# Patient Record
Sex: Male | Born: 1990 | Race: White | Hispanic: No | Marital: Single | Smoking: Never smoker
Health system: Southern US, Community
[De-identification: ages and names within clinical notes are randomized; demographics above are authoritative.]

## PROBLEM LIST (undated history)

## (undated) DIAGNOSIS — F419 Anxiety disorder, unspecified: Secondary | ICD-10-CM

## (undated) HISTORY — DX: Anxiety disorder, unspecified: F41.9

## (undated) HISTORY — PX: WISDOM TOOTH EXTRACTION: SHX21

---

## 2015-10-24 ENCOUNTER — Encounter: Payer: Self-pay | Admitting: Family Medicine

## 2015-10-24 ENCOUNTER — Ambulatory Visit (INDEPENDENT_AMBULATORY_CARE_PROVIDER_SITE_OTHER): Payer: Managed Care, Other (non HMO) | Admitting: Family Medicine

## 2015-10-24 VITALS — BP 134/72 | HR 67 | Temp 98.5°F | Resp 16 | Ht 71.0 in | Wt 265.0 lb

## 2015-10-24 DIAGNOSIS — Z0000 Encounter for general adult medical examination without abnormal findings: Secondary | ICD-10-CM

## 2015-10-24 DIAGNOSIS — F419 Anxiety disorder, unspecified: Secondary | ICD-10-CM | POA: Diagnosis not present

## 2015-10-24 NOTE — Assessment & Plan Note (Signed)
Controlled with Zoloft. Doing well with current dosing. Recheck 1 year.

## 2015-10-24 NOTE — Patient Instructions (Signed)

## 2015-10-24 NOTE — Progress Notes (Signed)
Subjective:    Patient ID: Andrew Mccormick, male    DOB: Sep 07, 1990, 25 y.o.   MRN: [id number]  HPI: Andrew Mccormick is a 25 y.o. male presenting on 10/24/2015 for Establish Care   HPI  Pt presents to establish care today. Previous care provider was Dr. Carlena SaxPulcinella has retired-.  It has been years since His last PCP visit. Records from previous provider will be requested and reviewed. Current medical problems include:  Anxiety: Placed on Zoloft 1 year ago. [facility] Seeing only refills.  Feels like zoloft is working well.   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   Exercise- once per week-mostly cardio and some resistance. Walks with dog.  Diet: Average. Does eat meat. Source of protein- tofu and beans. Not vegan.   Non smoker.    Past Medical History  Diagnosis Date  . Anxiety    Social History   Social History  . Marital Status: Single    Spouse Name: N/A  . Number of Children: N/A  . Years of Education: N/A   Occupational History  . Not on file.   Social History Main Topics  . Smoking status: Never Smoker   . Smokeless tobacco: Not on file  . Alcohol Use: No  . Drug Use: No  . Sexual Activity: Not on file   Other Topics Concern  . Not on file   Social History Narrative  . No narrative on file   Family History  Problem Relation Age of Onset  . Healthy Mother   . Healthy Father   . Heart disease Paternal Grandfather    No current outpatient prescriptions on file prior to visit.   No current facility-administered medications on file prior to visit.    Review of Systems  Constitutional: Negative for fever and chills.  HENT: Negative for congestion, sore throat and trouble swallowing.   Eyes: Negative for visual disturbance.  Respiratory: Negative for chest tightness, shortness of breath and wheezing.   Cardiovascular: Negative for chest pain, palpitations and leg swelling.  Gastrointestinal: Negative for nausea, vomiting  and abdominal pain.  Endocrine: Negative.  Negative for cold intolerance, heat intolerance, polydipsia, polyphagia and polyuria.  Genitourinary: Negative for dysuria, urgency, discharge, penile pain and testicular pain.  Musculoskeletal: Negative for back pain, joint swelling and arthralgias.  Skin: Negative.   Neurological: Negative for dizziness, weakness, numbness and headaches.  Psychiatric/Behavioral: Negative for sleep disturbance and dysphoric mood.   Per HPI unless specifically indicated above [facility] screen [facility] 10/24/2015  Decreased Interest 0  Down, Depressed, Hopeless 0  PHQ - 2 Score 0    GAD 7 : Generalized Anxiety Score 10/24/2015  Nervous, Anxious, on Edge 0  Control/stop worrying 0  Worry too much - different things 0  Trouble relaxing 0  Restless 0  Easily annoyed or irritable 1  Afraid - awful might happen 0  Total GAD 7 Score 1  Anxiety Difficulty Not difficult at all      Objective:    BP 134/72 mmHg  Pulse 67  Temp(Src) 98.5 F (36.9 C) (Oral)  Resp 16  Ht 5\' 11"  (1.803 m)  Wt 265 lb (120.203 kg)  BMI 36.98 kg/m2  Wt Readings from Last 3 Encounters:  10/24/15 265 lb (120.203 kg)    Physical Exam  Constitutional: He is oriented to person, place, and time. He appears well-developed and well-nourished. No distress.  HENT:  Head: Normocephalic and atraumatic.  Neck: Neck supple.  No thyromegaly present.  Cardiovascular: Normal rate, regular rhythm and normal heart sounds.  Exam reveals no gallop and no friction rub.   No murmur heard. Pulmonary/Chest: Effort normal and breath sounds normal. He has no wheezes.  Abdominal: Soft. Bowel sounds are normal. He exhibits no distension. There is no tenderness. There is no rebound.  Genitourinary:  Deferred due to patient preference.   Musculoskeletal: Normal range of motion. He exhibits no edema or tenderness.  Neurological: He is alert and oriented to person, place, and time. He has normal reflexes.    Skin: Skin is warm and dry. No rash noted. No erythema.  Psychiatric: He has a normal mood and affect. His behavior is normal. Thought content normal.   No results found for this or any previous visit.    Assessment & Plan:   Problem List Items Addressed This Visit      Other   Anxiety    Controlled with Zoloft. Doing well with current dosing. Recheck 1 year.       Relevant Medications   sertraline (ZOLOFT) 100 MG tablet    Other Visit Diagnoses    Preventative health care    -  Primary    Well visit. Health maintenance reviewed. Pt thinks labs were done when he started his job. Will fax over lab work. Return 1 year or PRN.        Meds ordered this encounter  Medications  . sertraline (ZOLOFT) 100 MG tablet    Sig: Take 100 mg by mouth daily.     Follow up plan: Return in about 1 year (around 10/23/2016), or if symptoms worsen or fail to improve.

## 2015-10-29 ENCOUNTER — Encounter: Payer: Self-pay | Admitting: Family Medicine

## 2016-02-13 ENCOUNTER — Emergency Department: Payer: Worker's Compensation

## 2016-02-13 ENCOUNTER — Encounter: Payer: Self-pay | Admitting: Emergency Medicine

## 2016-02-13 ENCOUNTER — Emergency Department
Admission: EM | Admit: 2016-02-13 | Discharge: 2016-02-13 | Disposition: A | Discharge status: Home / Self Care | Payer: Worker's Compensation | Attending: Emergency Medicine | Admitting: Emergency Medicine

## 2016-02-13 DIAGNOSIS — Y999 Unspecified external cause status: Secondary | ICD-10-CM | POA: Insufficient documentation

## 2016-02-13 DIAGNOSIS — Y92410 Unspecified street and highway as the place of occurrence of the external cause: Secondary | ICD-10-CM | POA: Insufficient documentation

## 2016-02-13 DIAGNOSIS — Y9389 Activity, other specified: Secondary | ICD-10-CM | POA: Insufficient documentation

## 2016-02-13 DIAGNOSIS — S161XXA Strain of muscle, fascia and tendon at neck level, initial encounter: Secondary | ICD-10-CM | POA: Insufficient documentation

## 2016-02-13 DIAGNOSIS — S0990XA Unspecified injury of head, initial encounter: Secondary | ICD-10-CM | POA: Diagnosis present
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
## 2016-10-30 ENCOUNTER — Encounter: Payer: Self-pay | Admitting: Family Medicine

## 2016-10-30 ENCOUNTER — Ambulatory Visit (INDEPENDENT_AMBULATORY_CARE_PROVIDER_SITE_OTHER): Payer: Managed Care, Other (non HMO) | Admitting: Family Medicine

## 2016-10-30 VITALS — BP 130/77 | HR 64 | Temp 98.3°F | Ht 71.0 in | Wt 248.0 lb

## 2016-10-30 DIAGNOSIS — F419 Anxiety disorder, unspecified: Secondary | ICD-10-CM | POA: Diagnosis not present

## 2016-10-30 DIAGNOSIS — Z0000 Encounter for general adult medical examination without abnormal findings: Secondary | ICD-10-CM

## 2016-10-30 DIAGNOSIS — Z7689 Persons encountering health services in other specified circumstances: Secondary | ICD-10-CM

## 2016-10-30 LAB — MICROSCOPIC EXAMINATION
BACTERIA UA: NONE SEEN
RBC, UA: NONE SEEN /hpf (ref 0–?)

## 2016-10-30 LAB — UA/M W/RFLX CULTURE, ROUTINE
BILIRUBIN UA: NEGATIVE
Glucose, UA: NEGATIVE
KETONES UA: NEGATIVE
Leukocytes, UA: NEGATIVE
Nitrite, UA: NEGATIVE
PH UA: 5.5 (ref 5.0–7.5)
RBC UA: NEGATIVE
Specific Gravity, UA: 1.025 (ref 1.005–1.030)
UUROB: 0.2 mg/dL (ref 0.2–1.0)

## 2016-10-30 NOTE — Assessment & Plan Note (Signed)
Stable on zoloft, continue current regimen 

## 2016-10-30 NOTE — Progress Notes (Signed)
BP 130/77   Pulse 64   Temp 98.3 F (36.8 C)   Ht 5\' 11"  (1.803 m)   Wt 248 lb (112.5 kg)   SpO2 97%   BMI 34.59 kg/m    Subjective:    Patient ID: Andrew Mccormick, male    DOB: Sep 21, 1990, 26 y.o.   MRN: [id number]  HPI: Andrew Mccormick is a 26 y.o. male presenting on 10/30/2016 for comprehensive medical examination. Current medical complaints include:see below  Patient presents today to establish care. Only medication he takes is zoloft. Has been on it for about 3 years now for his anxiety, no concerns there. Denies any mood issues, SI/HI, anxiousness affecting ADLs. States he works for [employer] and they are now requiring annual exams and he needs one by the end of the month. Fasting today.   Depression Screen done today and results listed below:  Depression screen [facility] 10/30/2016 10/24/2015  Decreased Interest 0 0  Down, Depressed, Hopeless 0 0  PHQ - 2 Score 0 0    The patient does not have a history of falls. I did not complete a risk assessment for falls. A plan of care for falls was not documented.   Past Medical History:  Past Medical History:  Diagnosis Date  . Anxiety     Surgical History:  Past Surgical History:  Procedure Laterality Date  . WISDOM TOOTH EXTRACTION      Medications:  Current Outpatient Prescriptions on File Prior to Visit  Medication Sig  . sertraline (ZOLOFT) 100 MG tablet Take 100 mg by mouth daily.   No current facility-administered medications on file prior to visit.     Allergies:  No Known Allergies  Social History:  Social History   Social History  . Marital status: Single    Spouse name: N/A  . Number of children: N/A  . Years of education: N/A   Occupational History  . Not on file.   Social History Main Topics  . Smoking status: Never Smoker  . Smokeless tobacco: Never Used  . Alcohol use No  . Drug use: No  . Sexual activity: Not on file   Other Topics Concern  . Not on file   Social History Narrative  . No  narrative on file   History  Smoking Status  . Never Smoker  Smokeless Tobacco  . Never Used   History  Alcohol Use No    Family History:  Family History  Problem Relation Age of Onset  . Healthy Mother   . Healthy Father   . Heart disease Paternal Grandfather   . Cancer Maternal Aunt   . COPD Neg Hx   . Diabetes Neg Hx   . Stroke Neg Hx     Past medical history, surgical history, medications, allergies, family history and social history reviewed with patient today and changes made to appropriate areas of the chart.   Review of Systems - General ROS: negative Psychological ROS: negative Ophthalmic ROS: negative ENT ROS: negative Allergy and Immunology ROS: negative Hematological and Lymphatic ROS: negative Respiratory ROS: no cough, shortness of breath, or wheezing Cardiovascular ROS: no chest pain or dyspnea on exertion Gastrointestinal ROS: no abdominal pain, change in bowel habits, or black or bloody stools Genito-Urinary ROS: no dysuria, trouble voiding, or hematuria Musculoskeletal ROS: negative Neurological ROS: no TIA or stroke symptoms Dermatological ROS: negative All other ROS negative except what is listed above and in the HPI.      Objective:  BP 130/77   Pulse 64   Temp 98.3 F (36.8 C)   Ht 5\' 11"  (1.803 m)   Wt 248 lb (112.5 kg)   SpO2 97%   BMI 34.59 kg/m   Wt Readings from Last 3 Encounters:  10/30/16 248 lb (112.5 kg)  02/13/16 250 lb (113.4 kg)  10/24/15 265 lb (120.2 kg)    Physical Exam  Constitutional: He is oriented to person, place, and time. He appears well-developed and well-nourished. No distress.  HENT:  Head: Atraumatic.  Right Ear: External ear normal.  Left Ear: External ear normal.  Nose: Nose normal.  Mouth/Throat: Oropharynx is clear and moist.  Eyes: Conjunctivae and EOM are normal. Pupils are equal, round, and reactive to light. No scleral icterus.  Neck: Normal range of motion. Neck supple. No thyromegaly  present.  Cardiovascular: Normal rate, regular rhythm, normal heart sounds and intact distal pulses.   No murmur heard. Pulmonary/Chest: Effort normal and breath sounds normal. No respiratory distress.  Abdominal: Soft. Bowel sounds are normal. He exhibits no distension and no mass. There is no tenderness. There is no guarding.  Musculoskeletal: Normal range of motion. He exhibits no edema or tenderness.  Lymphadenopathy:    He has no cervical adenopathy.  Neurological: He is alert and oriented to person, place, and time. He has normal reflexes. No cranial nerve deficit.  Skin: Skin is warm and dry. No rash noted.  Psychiatric: He has a normal mood and affect. His behavior is normal. Judgment and thought content normal.  Nursing note and vitals reviewed.  No results found for this or any previous visit.    Assessment & Plan:   Problem List Items Addressed This Visit      Other   Anxiety    Stable on zoloft, continue current regimen       Other Visit Diagnoses    Encounter to establish care    -  Primary   Annual physical exam       Fasting labs today. UTD on health maintenance   Relevant Orders   CBC with Differential/Platelet   Lipid Panel w/o Chol/HDL Ratio   Comprehensive metabolic panel   UA/M w/rflx Culture, Routine   TSH       Discussed aspirin prophylaxis for myocardial infarction prevention and decision was it was not indicated  LABORATORY TESTING:  Health maintenance labs ordered today as discussed above.   IMMUNIZATIONS:   - Tdap: Tetanus vaccination status reviewed: last tetanus booster within 10 years. - Influenza: Postponed to flu season  PATIENT COUNSELING:    Sexuality: Discussed sexually transmitted diseases, partner selection, use of condoms, avoidance of unintended pregnancy  and contraceptive alternatives.   Advised to avoid cigarette smoking.  I discussed with the patient that most people either abstain from alcohol or drink within safe limits  (<=14/week and <=4 drinks/occasion for males, <=7/weeks and <= 3 drinks/occasion for females) and that the risk for alcohol disorders and other health effects rises proportionally with the number of drinks per week and how often a drinker exceeds daily limits.  Discussed cessation/primary prevention of drug use and availability of treatment for abuse.   Diet: Encouraged to adjust caloric intake to maintain  or achieve ideal body weight, to reduce intake of dietary saturated fat and total fat, to limit sodium intake by avoiding high sodium foods and not adding table salt, and to maintain adequate dietary potassium and calcium preferably from fresh fruits, vegetables, and low-fat dairy products.    stressed  the importance of regular exercise  Injury prevention: Discussed safety belts, safety helmets, smoke detector, smoking near bedding or upholstery.   Dental health: Discussed importance of regular tooth brushing, flossing, and dental visits.   Follow up plan: NEXT PREVENTATIVE PHYSICAL DUE IN 1 YEAR. Return in about 1 year (around 10/30/2017) for CPE.

## 2016-10-30 NOTE — Progress Notes (Signed)
BP 130/77   Pulse 64   Temp 98.3 F (36.8 C)   Ht 5\' 11"  (1.803 m)   Wt 248 lb (112.5 kg)   SpO2 97%   BMI 34.59 kg/m    Subjective:    Patient ID: Andrew Mccormick, male    DOB: 04/02/1991, 26 y.o.   MRN: [id number]  HPI: Andrew Mccormick is a 26 y.o. male  Chief Complaint  Patient presents with  . Establish Care    no concerns today.    Patient presents today to establish care. Only medication he takes is zoloft. Has been on it for about 3 years now for his anxiety, no concerns there. Denies any mood issues, SI/HI, anxiousness affecting ADLs. States he works for [employer] and they are now requiring annual exams and he needs one by the end of the month. Fasting today.   Relevant past medical, surgical, family and social history reviewed and updated as indicated. Interim medical history since our last visit reviewed. Allergies and medications reviewed and updated.  [facility] 10/30/2016 10/24/2015  Decreased Interest 0 0  Down, Depressed, Hopeless 0 0  PHQ - 2 Score 0 0    Review of Systems  Constitutional: Negative.   HENT: Negative.   Eyes: Negative.   Respiratory: Negative.   Cardiovascular: Negative.   Gastrointestinal: Negative.   Genitourinary: Negative.   Musculoskeletal: Negative.   Skin: Negative.   Neurological: Negative.   Psychiatric/Behavioral: Negative.    Per HPI unless specifically indicated above     Objective:    BP 130/77   Pulse 64   Temp 98.3 F (36.8 C)   Ht 5\' 11"  (1.803 m)   Wt 248 lb (112.5 kg)   SpO2 97%   BMI 34.59 kg/m   Wt Readings from Last 3 Encounters:  10/30/16 248 lb (112.5 kg)  02/13/16 250 lb (113.4 kg)  10/24/15 265 lb (120.2 kg)    Physical Exam  Constitutional: He is oriented to person, place, and time. He appears well-developed and well-nourished.  HENT:  Head: Atraumatic.  Right Ear: External ear normal.  Left Ear: External ear normal.  Nose: Nose normal.  Mouth/Throat: Oropharynx is clear and moist.  No oropharyngeal exudate.  Eyes: Conjunctivae and EOM are normal. Pupils are equal, round, and reactive to light. No scleral icterus.  Neck: Normal range of motion. Neck supple. No thyromegaly present.  Cardiovascular: Normal rate, regular rhythm and normal heart sounds.   Pulmonary/Chest: Effort normal and breath sounds normal. No respiratory distress. He has no wheezes. He exhibits no tenderness.  Abdominal: Soft. Bowel sounds are normal. He exhibits no distension and no mass. There is no tenderness.  Musculoskeletal: Normal range of motion.  Lymphadenopathy:    He has no cervical adenopathy.  Neurological: He is alert and oriented to person, place, and time. No cranial nerve deficit. He exhibits normal muscle tone. Coordination normal.  Skin: Skin is warm and dry.  Psychiatric: He has a normal mood and affect. His behavior is normal.  Nursing note and vitals reviewed.   No results found for this or any previous visit.    Assessment & Plan:   Problem List Items Addressed This Visit      Other   Anxiety    Stable on zoloft, continue current regimen       Other Visit Diagnoses    Encounter to establish care    -  Primary   Annual physical exam  Fasting labs today. UTD on health maintenance   Relevant Orders   CBC with Differential/Platelet   Lipid Panel w/o Chol/HDL Ratio   Comprehensive metabolic panel   UA/M w/rflx Culture, Routine   TSH       Follow up plan: Return in about 1 year (around 10/30/2017) for CPE.

## 2016-10-30 NOTE — Patient Instructions (Signed)
Follow up in 1 year for CPE 

## 2016-10-31 LAB — COMPREHENSIVE METABOLIC PANEL
ALBUMIN: 4.8 g/dL (ref 3.5–5.5)
ALT: 31 IU/L (ref 0–44)
AST: 26 IU/L (ref 0–40)
Albumin/Globulin Ratio: 1.7 (ref 1.2–2.2)
Alkaline Phosphatase: 51 IU/L (ref 39–117)
BUN / CREAT RATIO: 21 — AB (ref 9–20)
BUN: 17 mg/dL (ref 6–20)
Bilirubin Total: 0.3 mg/dL (ref 0.0–1.2)
CALCIUM: 9.3 mg/dL (ref 8.7–10.2)
CO2: 23 mmol/L (ref 18–29)
CREATININE: 0.82 mg/dL (ref 0.76–1.27)
Chloride: 102 mmol/L (ref 96–106)
GFR, EST AFRICAN AMERICAN: 143 mL/min/{1.73_m2} (ref >59)
GFR, EST NON AFRICAN AMERICAN: 124 mL/min/{1.73_m2} (ref >59)
GLOBULIN, TOTAL: 2.9 g/dL (ref 1.5–4.5)
GLUCOSE: 92 mg/dL (ref 65–99)
Potassium: 4.4 mmol/L (ref 3.5–5.2)
SODIUM: 140 mmol/L (ref 134–144)
TOTAL PROTEIN: 7.7 g/dL (ref 6.0–8.5)

## 2016-10-31 LAB — CBC WITH DIFFERENTIAL/PLATELET
BASOS: 0 %
Basophils Absolute: 0 10*3/uL (ref 0.0–0.2)
EOS (ABSOLUTE): 0.1 10*3/uL (ref 0.0–0.4)
Eos: 2 %
Hematocrit: 40 % (ref 37.5–51.0)
Hemoglobin: 13.8 g/dL (ref 13.0–17.7)
IMMATURE GRANULOCYTES: 0 %
Immature Grans (Abs): 0 10*3/uL (ref 0.0–0.1)
LYMPHS ABS: 2 10*3/uL (ref 0.7–3.1)
Lymphs: 41 %
MCH: 25.7 pg — AB (ref 26.6–33.0)
MCHC: 34.5 g/dL (ref 31.5–35.7)
MCV: 75 fL — AB (ref 79–97)
MONOS ABS: 0.3 10*3/uL (ref 0.1–0.9)
Monocytes: 6 %
NEUTROS PCT: 51 %
Neutrophils Absolute: 2.5 10*3/uL (ref 1.4–7.0)
PLATELETS: 247 10*3/uL (ref 150–379)
RBC: 5.36 x10E6/uL (ref 4.14–5.80)
RDW: 14 % (ref 12.3–15.4)
WBC: 5 10*3/uL (ref 3.4–10.8)

## 2016-10-31 LAB — LIPID PANEL W/O CHOL/HDL RATIO
Cholesterol, Total: 183 mg/dL (ref 100–199)
HDL: 39 mg/dL — AB (ref >39)
LDL Calculated: 80 mg/dL (ref 0–99)
Triglycerides: 320 mg/dL — ABNORMAL HIGH (ref 0–149)
VLDL Cholesterol Cal: 64 mg/dL — ABNORMAL HIGH (ref 5–40)

## 2016-10-31 LAB — TSH: TSH: 1.53 u[IU]/mL (ref 0.450–4.500)

## 2016-11-04 ENCOUNTER — Encounter: Payer: Self-pay | Admitting: Family Medicine

## 2017-03-05 ENCOUNTER — Telehealth: Payer: Self-pay | Admitting: Family Medicine

## 2017-03-05 MED ORDER — SERTRALINE HCL 100 MG PO TABS: 150.0000 mg | ORAL_TABLET | Freq: Every day | ORAL | 6 refills | Status: DC

## 2017-03-05 NOTE — Telephone Encounter (Signed)
Routing to provider. RX in chart says 100 mg, pt states he takes 150. Please advise.

## 2017-03-05 NOTE — Telephone Encounter (Signed)
Please call and clarify dose with pt as there is a discrepancy. I'm happy to refill the dose he's currently on

## 2017-03-05 NOTE — Telephone Encounter (Signed)
It is  tablet, patient has been taking 1 and 1/2 tablet daily.   Patient is taking .

## 2017-03-05 NOTE — Telephone Encounter (Signed)
Patient states he and Rachel discussed his ZFleet Contrasft at this visit and per patient she agreed to prescribe this medication.    Zoloft 150 daily  [facility]  [place] [phone]

## 2017-03-05 NOTE — Telephone Encounter (Signed)
Rx for 150 mg (1.5 tabs daily) sent to [facility]

## 2017-07-11 ENCOUNTER — Other Ambulatory Visit: Payer: Self-pay

## 2017-07-11 ENCOUNTER — Encounter: Payer: Self-pay | Admitting: Emergency Medicine

## 2017-07-11 ENCOUNTER — Ambulatory Visit
Admission: EM | Admit: 2017-07-11 | Discharge: 2017-07-11 | Disposition: A | Discharge status: Home / Self Care | Payer: PRIVATE HEALTH INSURANCE | Attending: Emergency Medicine | Admitting: Emergency Medicine

## 2017-07-11 DIAGNOSIS — B349 Viral infection, unspecified: Secondary | ICD-10-CM | POA: Diagnosis not present

## 2017-07-11 DIAGNOSIS — J029 Acute pharyngitis, unspecified: Secondary | ICD-10-CM | POA: Diagnosis not present

## 2017-07-11 LAB — RAPID STREP SCREEN (MED CTR MEBANE ONLY): STREPTOCOCCUS, GROUP A SCREEN (DIRECT): NEGATIVE

## 2017-07-11 MED ORDER — PREDNISONE 10 MG (21) PO TBPK: 10.0000 mg | ORAL_TABLET | Freq: Every day | ORAL | 0 refills | Status: DC

## 2017-07-11 NOTE — Discharge Instructions (Signed)
Your step test was negative You may have something viral we will send off your throat culture if anything tests positive we will call you and treat as needed Take the steroid with food stay hydrated  May use motrin as needed for pain  May use warm salt water gargle or drink warm lemon tea

## 2017-07-11 NOTE — ED Triage Notes (Signed)
Patient c/o sore throat and fatigue for the past couple of days.  Patient denies fevers.

## 2017-07-11 NOTE — ED Provider Notes (Signed)
[facility]    CSN: [id number] Arrival date & time: 07/11/17  0948     History   Chief Complaint Chief Complaint  Patient presents with  . Sore Throat    APPOINTMENT    HPI Alem Fahl is a 27 y.o. male.   Pt states that he has felt sluggish and tired with sore throat for a few days. States that he never gets sick but all his co workers are sick. deneis any fever, no n/v/d no abd pain. Has not taken anything.       Past Medical History:  Diagnosis Date  . Anxiety     Patient Active Problem List   Diagnosis Date Noted  . Anxiety 10/24/2015    Past Surgical History:  Procedure Laterality Date  . WISDOM TOOTH EXTRACTION         Home Medications    Prior to Admission medications   Medication Sig Start Date End Date Taking? Authorizing Provider  predniSONE (STERAPRED UNI-PAK 21 TAB) 10 MG (21) TBPK tablet Take 1 tablet (10 mg total) by mouth daily. Take 6 tabs by mouth daily  for 2 days, then 5 tabs for 2 days, then 4 tabs for 2 days, then 3 tabs for 2 days, 2 tabs for 2 days, then 1 tab by mouth daily for 2 days 07/11/17   Coralyn Mark, NP  sertraline (ZOLOFT) 100 MG tablet Take 1.5 tablets (150 mg total) by mouth daily. 03/05/17   Particia Nearing, PA-C    Family History Family History  Problem Relation Age of Onset  . Healthy Mother   . Healthy Father   . Heart disease Paternal Grandfather   . Cancer Maternal Aunt   . COPD Neg Hx   . Diabetes Neg Hx   . Stroke Neg Hx     Social History Social History   Tobacco Use  . Smoking status: Never Smoker  . Smokeless tobacco: Never Used  Substance Use Topics  . Alcohol use: No  . Drug use: No     Allergies   Patient has no known allergies.   Review of Systems Review of Systems  Constitutional: Positive for activity change.  HENT: Positive for sore throat.   Respiratory: Negative.   Cardiovascular: Negative.   Gastrointestinal: Negative.   Musculoskeletal: Negative.     Skin: Negative.   Neurological: Negative.      Physical Exam Triage Vital Signs ED Triage Vitals  Enc Vitals Group     BP 07/11/17 1001 (!) 144/87     Pulse Rate 07/11/17 1001 99     Resp 07/11/17 1001 16     Temp 07/11/17 1001 98.8 F (37.1 C)     Temp Source 07/11/17 1001 Oral     SpO2 07/11/17 1001 99 %     Weight 07/11/17 0958 250 lb (113.4 kg)     Height 07/11/17 0958 5\' 11"  (1.803 m)     Head Circumference --      Peak Flow --      Pain Score 07/11/17 0958 1     Pain Loc --      Pain Edu? --      Excl. in GC? --    No data found.  Updated Vital Signs BP (!) 144/87 (BP Location: Left Arm)   Pulse 99   Temp 98.8 F (37.1 C) (Oral)   Resp 16   Ht 5\' 11"  (1.803 m)   Wt 250 lb (113.4 kg)   SpO2  99%   BMI 34.87 kg/m   Visual Acuity     Physical Exam  Constitutional: He appears well-developed.  HENT:  Head: Normocephalic.  Right Ear: Tympanic membrane normal.  Left Ear: Tympanic membrane normal.  Mouth/Throat: Uvula is midline, oropharynx is clear and moist and mucous membranes are normal.  Neck: Normal range of motion.  Cardiovascular: Normal rate and normal heart sounds.  Pulmonary/Chest: Effort normal and breath sounds normal.  Neurological: He is alert.  Skin: Skin is warm.     UC Treatments / Results  Labs (all labs ordered are listed, but only abnormal results are displayed) Labs Reviewed  RAPID STREP SCREEN (NOT AT [facility])  CULTURE, GROUP A STREP [facility]    EKG  EKG Interpretation None       Radiology No results found.  Procedures Procedures (including critical care time)  Medications Ordered in UC Medications - No data to display   Initial Impression / Assessment and Plan / UC Course  I have reviewed the triage vital signs and the nursing notes.  Pertinent labs & imaging results that were available during my care of the patient were reviewed by me and considered in my medical decision making (see chart for details).    You  may have something viral we will send off your throat culture if anything tests positive we will call you and treat as needed Take the steroid with food stay hydrated  May use motrin as needed for pain  May use warm salt water gargle or drink warm lemon tea    Final Clinical Impressions(s) / UC Diagnoses   Final diagnoses:  Sore throat  Viral pharyngitis    ED Discharge Orders        Ordered    predniSONE (STERAPRED UNI-PAK 21 TAB) 10 MG (21) TBPK tablet  Daily     07/11/17 1013       Controlled Substance Prescriptions [place] Controlled Substance Registry consulted? Not Applicable   Coralyn MarkMitchell, Timmia Cogburn L, NP 07/11/17 1019

## 2017-07-14 ENCOUNTER — Telehealth: Payer: Self-pay

## 2017-07-14 LAB — CULTURE, GROUP A STREP (THRC)

## 2017-07-14 NOTE — Telephone Encounter (Signed)
Called pt for f/u and to inform of negative strep cx. He is doing better and will f/u as needed

## 2017-10-20 ENCOUNTER — Other Ambulatory Visit: Payer: Self-pay | Admitting: Family Medicine

## 2017-11-30 ENCOUNTER — Other Ambulatory Visit: Payer: Self-pay | Admitting: Family Medicine

## 2017-12-03 NOTE — Telephone Encounter (Signed)
Interface request refill for Zoloft 100mg    Last refill 10/21/17 #45   LOV 10/24/17 with Roosvelt MaserLane , Rachel, PA-C   Pharmacy [facility] [place]

## 2017-12-23 ENCOUNTER — Other Ambulatory Visit: Payer: Self-pay | Admitting: Family Medicine

## 2017-12-24 ENCOUNTER — Other Ambulatory Visit: Payer: Self-pay

## 2017-12-24 ENCOUNTER — Encounter: Payer: Self-pay | Admitting: Family Medicine

## 2017-12-24 ENCOUNTER — Ambulatory Visit: Payer: PRIVATE HEALTH INSURANCE | Admitting: Family Medicine

## 2017-12-24 VITALS — BP 131/79 | HR 70 | Temp 98.2°F | Ht 71.0 in | Wt 275.0 lb

## 2017-12-24 DIAGNOSIS — F419 Anxiety disorder, unspecified: Secondary | ICD-10-CM

## 2017-12-24 MED ORDER — SERTRALINE HCL 100 MG PO TABS: 100.0000 mg | ORAL_TABLET | Freq: Every day | ORAL | 3 refills | Status: DC

## 2017-12-24 NOTE — Progress Notes (Signed)
   BP 131/79   Pulse 70   Temp 98.2 F (36.8 C) (Oral)   Ht 5\' 11"  (1.803 m)   Wt 275 lb (124.7 kg)   SpO2 96%   BMI 38.35 kg/m    Subjective:    Patient ID: Andrew Mccormick, male    DOB: [birth year], 27 y.o.   MRN: [id number]  HPI: Andrew Mccormick is a 27 y.o. male  Chief Complaint  Patient presents with  . Medication Refill    zoloft   Pt here today for anxiety f/u. Taking zoloft with good relief. No SI/HI, panic episodes, sleep issues, severe mood swings. Denies side effects.   Depression screen [facility]9 10/30/2016 10/24/2015  Decreased Interest 0 0  Down, Depressed, Hopeless 0 0  PHQ - 2 Score 0 0    Relevant past medical, surgical, family and social history reviewed and updated as indicated. Interim medical history since our last visit reviewed. Allergies and medications reviewed and updated.  Review of Systems  Per HPI unless specifically indicated above     Objective:    BP 131/79   Pulse 70   Temp 98.2 F (36.8 C) (Oral)   Ht 5\' 11"  (1.803 m)   Wt 275 lb (124.7 kg)   SpO2 96%   BMI 38.35 kg/m   Wt Readings from Last 3 Encounters:  12/24/17 275 lb (124.7 kg)  07/11/17 250 lb (113.4 kg)  10/30/16 248 lb (112.5 kg)    Physical Exam  Constitutional: He is oriented to person, place, and time. He appears well-developed and well-nourished.  HENT:  Head: Atraumatic.  Eyes: Pupils are equal, round, and reactive to light. Conjunctivae are normal.  Neck: Normal range of motion. Neck supple.  Cardiovascular: Normal rate and regular rhythm.  Pulmonary/Chest: Effort normal and breath sounds normal.  Musculoskeletal: Normal range of motion.  Neurological: He is alert and oriented to person, place, and time.  Skin: Skin is warm and dry.  Psychiatric: He has a normal mood and affect. His behavior is normal.  Nursing note and vitals reviewed.   Results for orders placed or performed during the hospital encounter of 07/11/17  Rapid strep screen  Result Value Ref Range   Streptococcus, Group A Screen (Direct) NEGATIVE NEGATIVE  Culture, group A strep  Result Value Ref Range   Specimen Description      THROAT Performed at [facility], [address] [place], [place] [address]    Special Requests      NONE Reflexed from [id number] Performed at [facility], [address] [place], [place] [address]    Culture      NO GROUP A STREP (S.PYOGENES) ISOLATED Performed at [facility], [address] [place], [place] [address]    Report Status 07/14/2017 FINAL       Assessment & Plan:   Problem List Items Addressed This Visit      Other   Anxiety - Primary    Stable and under good control, continue current regimen      Relevant Medications   sertraline (ZOLOFT) 100 MG tablet       Follow up plan: Return in about 1 year (around 12/25/2018) for Anxiety f/u.

## 2017-12-24 NOTE — Patient Instructions (Signed)
Follow up in 1 year.

## 2017-12-24 NOTE — Telephone Encounter (Signed)
sertraline refill Last Refill:12/03/17 # 45 Last OV: 10/30/16 (has appt today) PCP: Roosvelt Maserachel Lane PA Pharmacy:[facility] [address] [place]

## 2017-12-24 NOTE — Assessment & Plan Note (Signed)
Stable and under good control, continue current regimen 

## 2018-01-07 ENCOUNTER — Telehealth: Payer: Self-pay | Admitting: Family Medicine

## 2018-01-07 NOTE — Telephone Encounter (Signed)
Patient is not in the registry.

## 2018-01-07 NOTE — Telephone Encounter (Signed)
Patient notified. He contacted his mother and stated he had his shots in 2002. Still offered patient titers if he wanted to be on the safe side and patient declined and stated it was minor.  Routing to provider as Lorain ChildesFYI.
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
## 2018-01-07 NOTE — Telephone Encounter (Signed)
Please call pt and let him know. He will either need to access childhood vaccine records himself or have titers drawn. Happy to draw them here for him

## 2018-01-07 NOTE — Telephone Encounter (Signed)
Can you access this info from the immunization database?
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
## 2018-03-17 ENCOUNTER — Encounter: Payer: Self-pay | Admitting: Family Medicine

## 2018-03-17 ENCOUNTER — Ambulatory Visit: Payer: PRIVATE HEALTH INSURANCE | Admitting: Family Medicine

## 2018-03-17 VITALS — BP 137/84 | HR 85 | Wt 281.0 lb

## 2018-03-17 DIAGNOSIS — Z23 Encounter for immunization: Secondary | ICD-10-CM | POA: Diagnosis not present

## 2018-03-17 DIAGNOSIS — Z0000 Encounter for general adult medical examination without abnormal findings: Secondary | ICD-10-CM | POA: Diagnosis not present

## 2018-03-17 DIAGNOSIS — F419 Anxiety disorder, unspecified: Secondary | ICD-10-CM | POA: Diagnosis not present

## 2018-03-17 LAB — UA/M W/RFLX CULTURE, ROUTINE
BILIRUBIN UA: NEGATIVE
GLUCOSE, UA: NEGATIVE
Ketones, UA: NEGATIVE
Leukocytes, UA: NEGATIVE
Nitrite, UA: NEGATIVE
Protein, UA: NEGATIVE
RBC, UA: NEGATIVE
Specific Gravity, UA: 1.025 (ref 1.005–1.030)
UUROB: 0.2 mg/dL (ref 0.2–1.0)
pH, UA: 6.5 (ref 5.0–7.5)

## 2018-03-17 MED ORDER — SERTRALINE HCL 100 MG PO TABS: 150.0000 mg | ORAL_TABLET | Freq: Every day | ORAL | 3 refills | Status: DC

## 2018-03-17 NOTE — Progress Notes (Deleted)
   BP 137/84   Pulse 85   Wt 281 lb (127.5 kg)   SpO2 98%   BMI 39.19 kg/m    Subjective:    Patient ID: Andrew Mccormick, male    DOB: 26-Jul-1990, 27 y.o.   MRN: [id number]  HPI: Andrew Mccormick is a 27 y.o. male  Chief Complaint  Patient presents with  . Form Completion    Relevant past medical, surgical, family and social history reviewed and updated as indicated. Interim medical history since our last visit reviewed. Allergies and medications reviewed and updated.  Review of Systems  Per HPI unless specifically indicated above     Objective:    BP 137/84   Pulse 85   Wt 281 lb (127.5 kg)   SpO2 98%   BMI 39.19 kg/m   Wt Readings from Last 3 Encounters:  03/17/18 281 lb (127.5 kg)  12/24/17 275 lb (124.7 kg)  07/11/17 250 lb (113.4 kg)    Physical Exam  Results for orders placed or performed during the hospital encounter of 07/11/17  Rapid strep screen  Result Value Ref Range   Streptococcus, Group A Screen (Direct) NEGATIVE NEGATIVE  Culture, group A strep  Result Value Ref Range   Specimen Description      THROAT Performed at [facility], [address] [place], [place] [address]    Special Requests      NONE Reflexed from [id number] Performed at [facility], [address] [place], [place] [address]    Culture      NO GROUP A STREP (S.PYOGENES) ISOLATED Performed at [facility], [address], [place], [place] [address]    Report Status 07/14/2017 FINAL       Assessment & Plan:   Problem List Items Addressed This Visit    None    Visit Diagnoses    Needs flu shot    -  Primary   Relevant Orders   Flu Vaccine QUAD 6+ mos PF IM (Fluarix Quad PF) (Completed)       Follow up plan: No follow-ups on file.

## 2018-03-18 LAB — CBC WITH DIFFERENTIAL/PLATELET
BASOS: 1 %
Basophils Absolute: 0 10*3/uL (ref 0.0–0.2)
EOS (ABSOLUTE): 0.1 10*3/uL (ref 0.0–0.4)
Eos: 2 %
HEMOGLOBIN: 13.5 g/dL (ref 13.0–17.7)
Hematocrit: 39.9 % (ref 37.5–51.0)
IMMATURE GRANS (ABS): 0 10*3/uL (ref 0.0–0.1)
Immature Granulocytes: 0 %
LYMPHS ABS: 3.1 10*3/uL (ref 0.7–3.1)
LYMPHS: 44 %
MCH: 25.7 pg — AB (ref 26.6–33.0)
MCHC: 33.8 g/dL (ref 31.5–35.7)
MCV: 76 fL — AB (ref 79–97)
Monocytes Absolute: 0.4 10*3/uL (ref 0.1–0.9)
Monocytes: 6 %
Neutrophils Absolute: 3.3 10*3/uL (ref 1.4–7.0)
Neutrophils: 47 %
PLATELETS: 319 10*3/uL (ref 150–450)
RBC: 5.25 x10E6/uL (ref 4.14–5.80)
RDW: 13.8 % (ref 12.3–15.4)
WBC: 7.1 10*3/uL (ref 3.4–10.8)

## 2018-03-18 LAB — LIPID PANEL W/O CHOL/HDL RATIO
Cholesterol, Total: 192 mg/dL (ref 100–199)
HDL: 33 mg/dL — ABNORMAL LOW (ref >39)
Triglycerides: 524 mg/dL — ABNORMAL HIGH (ref 0–149)

## 2018-03-18 LAB — COMPREHENSIVE METABOLIC PANEL
ALBUMIN: 5.5 g/dL (ref 3.5–5.5)
ALK PHOS: 53 IU/L (ref 39–117)
ALT: 36 IU/L (ref 0–44)
AST: 27 IU/L (ref 0–40)
Albumin/Globulin Ratio: 2.2 (ref 1.2–2.2)
BILIRUBIN TOTAL: 0.3 mg/dL (ref 0.0–1.2)
BUN / CREAT RATIO: 13 (ref 9–20)
BUN: 11 mg/dL (ref 6–20)
CHLORIDE: 100 mmol/L (ref 96–106)
CO2: 26 mmol/L (ref 20–29)
Calcium: 9.8 mg/dL (ref 8.7–10.2)
Creatinine, Ser: 0.85 mg/dL (ref 0.76–1.27)
GFR calc non Af Amer: 121 mL/min/{1.73_m2} (ref >59)
GFR, EST AFRICAN AMERICAN: 140 mL/min/{1.73_m2} (ref >59)
Globulin, Total: 2.5 g/dL (ref 1.5–4.5)
Glucose: 92 mg/dL (ref 65–99)
POTASSIUM: 4.1 mmol/L (ref 3.5–5.2)
Sodium: 141 mmol/L (ref 134–144)
TOTAL PROTEIN: 8 g/dL (ref 6.0–8.5)

## 2018-03-20 NOTE — Progress Notes (Signed)
BP 137/84   Pulse 85   Wt 281 lb (127.5 kg)   SpO2 98%   BMI 39.19 kg/m    Subjective:    Patient ID: Andrew Mccormick, male    DOB: [birth year], 27 y.o.   MRN: [id number]  HPI: Andrew Mccormick is a 27 y.o. male presenting on 03/17/2018 for comprehensive medical examination. Current medical complaints include:has some forms for an upcoming trip out of the country.   Anxiety is stable on zoloft 150 mg. No side effects, takes daily. Denies SI/HI.   He currently lives with: Interim Problems from his last visit: no  Depression Screen done today and results listed below:  Depression screen [facility] 2/9 03/20/2018 12/24/2017 10/30/2016 10/24/2015  Decreased Interest 0 0 0 0  Down, Depressed, Hopeless 0 0 0 0  PHQ - 2 Score 0 0 0 0  Altered sleeping - 0 - -  Tired, decreased energy - 1 - -  Change in appetite - 1 - -  Feeling bad or failure about yourself  - 0 - -  Trouble concentrating - 0 - -  Moving slowly or fidgety/restless - 0 - -  Suicidal thoughts - 0 - -  PHQ-9 Score - 2 - -    The patient does not have a history of falls. I did not complete a risk assessment for falls. A plan of care for falls was not documented.   Past Medical History:  Past Medical History:  Diagnosis Date  . Anxiety     Surgical History:  Past Surgical History:  Procedure Laterality Date  . WISDOM TOOTH EXTRACTION      Medications:  No current outpatient medications on file prior to visit.   No current facility-administered medications on file prior to visit.     Allergies:  No Known Allergies  Social History:  Social History   Socioeconomic History  . Marital status: Single    Spouse name: Not on file  . Number of children: Not on file  . Years of education: Not on file  . Highest education level: Not on file  Occupational History  . Not on file  Social Needs  . Financial resource strain: Not on file  . Food insecurity:    Worry: Not on file    Inability: Not on file  . Transportation  needs:    Medical: Not on file    Non-medical: Not on file  Tobacco Use  . Smoking status: Never Smoker  . Smokeless tobacco: Never Used  Substance and Sexual Activity  . Alcohol use: No  . Drug use: No  . Sexual activity: Not on file  Lifestyle  . Physical activity:    Days per week: Not on file    Minutes per session: Not on file  . Stress: Not on file  Relationships  . Social connections:    Talks on phone: Not on file    Gets together: Not on file    Attends religious service: Not on file    Active member of club or organization: Not on file    Attends meetings of clubs or organizations: Not on file    Relationship status: Not on file  . Intimate partner violence:    Fear of current or ex partner: Not on file    Emotionally abused: Not on file    Physically abused: Not on file    Forced sexual activity: Not on file  Other Topics Concern  . Not on file  Social History  Narrative  . Not on file   Social History   Tobacco Use  Smoking Status Never Smoker  Smokeless Tobacco Never Used   Social History   Substance and Sexual Activity  Alcohol Use No    Family History:  Family History  Problem Relation Age of Onset  . Healthy Mother   . Healthy Father   . Heart disease Paternal Grandfather   . Cancer Maternal Aunt   . COPD Neg Hx   . Diabetes Neg Hx   . Stroke Neg Hx     Past medical history, surgical history, medications, allergies, family history and social history reviewed with patient today and changes made to appropriate areas of the chart.   Review of Systems - General ROS: negative Psychological ROS: negative Ophthalmic ROS: negative ENT ROS: negative Allergy and Immunology ROS: negative Hematological and Lymphatic ROS: negative Endocrine ROS: negative Respiratory ROS: no cough, shortness of breath, or wheezing Cardiovascular ROS: no chest pain or dyspnea on exertion Gastrointestinal ROS: no abdominal pain, change in bowel habits, or black or  bloody stools Genito-Urinary ROS: no dysuria, trouble voiding, or hematuria Musculoskeletal ROS: negative Neurological ROS: no TIA or stroke symptoms Dermatological ROS: negative All other ROS negative except what is listed above and in the HPI.      Objective:    BP 137/84   Pulse 85   Wt 281 lb (127.5 kg)   SpO2 98%   BMI 39.19 kg/m   Wt Readings from Last 3 Encounters:  03/17/18 281 lb (127.5 kg)  12/24/17 275 lb (124.7 kg)  07/11/17 250 lb (113.4 kg)    Physical Exam  Constitutional: He is oriented to person, place, and time. He appears well-developed and well-nourished. No distress.  HENT:  Head: Atraumatic.  Right Ear: External ear normal.  Left Ear: External ear normal.  Nose: Nose normal.  Mouth/Throat: Oropharynx is clear and moist.  Eyes: Pupils are equal, round, and reactive to light. Conjunctivae are normal. No scleral icterus.  Neck: Normal range of motion. Neck supple.  Cardiovascular: Normal rate, regular rhythm, normal heart sounds and intact distal pulses.  No murmur heard. Pulmonary/Chest: Effort normal and breath sounds normal. No respiratory distress.  Abdominal: Soft. Bowel sounds are normal. He exhibits no distension and no mass. There is no tenderness. There is no guarding.  Genitourinary:  Genitourinary Comments: Patient declines exam  Musculoskeletal: Normal range of motion. He exhibits no edema or tenderness.  Neurological: He is alert and oriented to person, place, and time. He has normal reflexes.  Skin: Skin is warm and dry. No rash noted.  Psychiatric: He has a normal mood and affect. His behavior is normal.  Nursing note and vitals reviewed.   Results for orders placed or performed in visit on 03/17/18  CBC with Differential/Platelet  Result Value Ref Range   WBC 7.1 3.4 - 10.8 x10E3/uL   RBC 5.25 4.14 - 5.80 x10E6/uL   Hemoglobin 13.5 13.0 - 17.7 g/dL   Hematocrit 54.0 98.1 - 51.0 %   MCV 76 (L) 79 - 97 fL   MCH 25.7 (L) 26.6 - 33.0  pg   MCHC 33.8 31.5 - 35.7 g/dL   RDW 19.1 47.8 - 29.5 %   Platelets 319 150 - 450 x10E3/uL   Neutrophils 47 Not Estab. %   Lymphs 44 Not Estab. %   Monocytes 6 Not Estab. %   Eos 2 Not Estab. %   Basos 1 Not Estab. %   Neutrophils Absolute 3.3  1.4 - 7.0 x10E3/uL   Lymphocytes Absolute 3.1 0.7 - 3.1 x10E3/uL   Monocytes Absolute 0.4 0.1 - 0.9 x10E3/uL   EOS (ABSOLUTE) 0.1 0.0 - 0.4 x10E3/uL   Basophils Absolute 0.0 0.0 - 0.2 x10E3/uL   Immature Granulocytes 0 Not Estab. %   Immature Grans (Abs) 0.0 0.0 - 0.1 x10E3/uL  Comprehensive metabolic panel  Result Value Ref Range   Glucose 92 65 - 99 mg/dL   BUN 11 6 - 20 mg/dL   Creatinine, Ser 1.47 0.76 - 1.27 mg/dL   GFR calc non Af Amer 121 >59 mL/min/1.73   GFR calc Af Amer 140 >59 mL/min/1.73   BUN/Creatinine Ratio 13 9 - 20   Sodium 141 134 - 144 mmol/L   Potassium 4.1 3.5 - 5.2 mmol/L   Chloride 100 96 - 106 mmol/L   CO2 26 20 - 29 mmol/L   Calcium 9.8 8.7 - 10.2 mg/dL   Total Protein 8.0 6.0 - 8.5 g/dL   Albumin 5.5 3.5 - 5.5 g/dL   Globulin, Total 2.5 1.5 - 4.5 g/dL   Albumin/Globulin Ratio 2.2 1.2 - 2.2   Bilirubin Total 0.3 0.0 - 1.2 mg/dL   Alkaline Phosphatase 53 39 - 117 IU/L   AST 27 0 - 40 IU/L   ALT 36 0 - 44 IU/L  Lipid Panel w/o Chol/HDL Ratio  Result Value Ref Range   Cholesterol, Total 192 100 - 199 mg/dL   Triglycerides 829 (H) 0 - 149 mg/dL   HDL 33 (L) >56 mg/dL   VLDL Cholesterol Cal Comment 5 - 40 mg/dL   LDL Calculated Comment 0 - 99 mg/dL  UA/M w/rflx Culture, Routine  Result Value Ref Range   Specific Gravity, UA 1.025 1.005 - 1.030   pH, UA 6.5 5.0 - 7.5   Color, UA Yellow Yellow   Appearance Ur Hazy (A) Clear   Leukocytes, UA Negative Negative   Protein, UA Negative Negative/Trace   Glucose, UA Negative Negative   Ketones, UA Negative Negative   RBC, UA Negative Negative   Bilirubin, UA Negative Negative   Urobilinogen, Ur 0.2 0.2 - 1.0 mg/dL   Nitrite, UA Negative Negative        Assessment & Plan:   Problem List Items Addressed This Visit      Other   Anxiety - Primary    Stable on 150 mg zoloft. Continue current regimen      Relevant Medications   sertraline (ZOLOFT) 100 MG tablet    Other Visit Diagnoses    Annual physical exam       Relevant Orders   CBC with Differential/Platelet (Completed)   Comprehensive metabolic panel (Completed)   Lipid Panel w/o Chol/HDL Ratio (Completed)   UA/M w/rflx Culture, Routine (Completed)   Needs flu shot       Relevant Orders   Flu Vaccine QUAD 6+ mos PF IM (Fluarix Quad PF) (Completed)       Discussed aspirin prophylaxis for myocardial infarction prevention and decision was it was not indicated  LABORATORY TESTING:  Health maintenance labs ordered today as discussed above.   The natural history of prostate cancer and ongoing controversy regarding screening and potential treatment outcomes of prostate cancer has been discussed with the patient. The meaning of a false positive PSA and a false negative PSA has been discussed. He indicates understanding of the limitations of this screening test and wishes not to proceed with screening PSA testing.   IMMUNIZATIONS:   - Tdap: Tetanus  vaccination status reviewed: last tetanus booster within 10 years. - Influenza: Administered today  PATIENT COUNSELING:    Sexuality: Discussed sexually transmitted diseases, partner selection, use of condoms, avoidance of unintended pregnancy  and contraceptive alternatives.   Advised to avoid cigarette smoking.  I discussed with the patient that most people either abstain from alcohol or drink within safe limits (<=14/week and <=4 drinks/occasion for males, <=7/weeks and <= 3 drinks/occasion for females) and that the risk for alcohol disorders and other health effects rises proportionally with the number of drinks per week and how often a drinker exceeds daily limits.  Discussed cessation/primary prevention of drug use and  availability of treatment for abuse.   Diet: Encouraged to adjust caloric intake to maintain  or achieve ideal body weight, to reduce intake of dietary saturated fat and total fat, to limit sodium intake by avoiding high sodium foods and not adding table salt, and to maintain adequate dietary potassium and calcium preferably from fresh fruits, vegetables, and low-fat dairy products.    stressed the importance of regular exercise  Injury prevention: Discussed safety belts, safety helmets, smoke detector, smoking near bedding or upholstery.   Dental health: Discussed importance of regular tooth brushing, flossing, and dental visits.   Follow up plan: NEXT PREVENTATIVE PHYSICAL DUE IN 1 YEAR. Return in about 1 year (around 03/18/2019) for CPE.

## 2018-03-20 NOTE — Assessment & Plan Note (Signed)
Stable on 150 mg zoloft. Continue current regimen

## 2018-03-20 NOTE — Patient Instructions (Signed)
Follow up for CPE 

## 2018-04-01 ENCOUNTER — Emergency Department
Admission: EM | Admit: 2018-04-01 | Discharge: 2018-04-01 | Disposition: A | Discharge status: Home / Self Care | Payer: Worker's Compensation | Attending: Student in an Organized Health Care Education/Training Program | Admitting: Student in an Organized Health Care Education/Training Program

## 2018-04-01 ENCOUNTER — Encounter: Payer: Self-pay | Admitting: Emergency Medicine

## 2018-04-01 ENCOUNTER — Other Ambulatory Visit: Payer: Self-pay

## 2018-04-01 DIAGNOSIS — S0181XA Laceration without foreign body of other part of head, initial encounter: Secondary | ICD-10-CM | POA: Insufficient documentation

## 2018-04-01 DIAGNOSIS — Y929 Unspecified place or not applicable: Secondary | ICD-10-CM | POA: Diagnosis not present

## 2018-04-01 DIAGNOSIS — W228XXA Striking against or struck by other objects, initial encounter: Secondary | ICD-10-CM | POA: Insufficient documentation

## 2018-04-01 DIAGNOSIS — S0990XA Unspecified injury of head, initial encounter: Secondary | ICD-10-CM | POA: Diagnosis present

## 2018-04-01 DIAGNOSIS — Z79899 Other long term (current) drug therapy: Secondary | ICD-10-CM | POA: Insufficient documentation

## 2018-04-01 DIAGNOSIS — Y9389 Activity, other specified: Secondary | ICD-10-CM | POA: Diagnosis not present

## 2018-04-01 DIAGNOSIS — Y990 Civilian activity done for income or pay: Secondary | ICD-10-CM | POA: Insufficient documentation

## 2018-04-01 MED ORDER — BACITRACIN-NEOMYCIN-POLYMYXIN 400-5-5000 EX OINT
TOPICAL_OINTMENT | Freq: Once | CUTANEOUS | Status: AC
  Administered 2018-04-01: 1 via TOPICAL
  Filled 2018-04-01: qty 1

## 2018-04-01 MED ORDER — LIDOCAINE HCL (PF) 1 % IJ SOLN
2.0000 mL | Freq: Once | INTRAMUSCULAR | Status: AC
  Administered 2018-04-01: 2 mL
  Filled 2018-04-01: qty 5

## 2018-04-01 NOTE — Discharge Instructions (Signed)
You received 3 stitches today. Ok to wash face with warm water and soap but do not scrub over stitches. Apply Neosporin 2 x day, and leave sutures open to air. Follow up with your PCP in 1 week for suture removal.

## 2018-04-01 NOTE — ED Notes (Signed)
Reviewed discharge instructions, follow-up care, OTC antibiotic ointment, and laceration/suture care with patient. Patient verbalized understanding of all information reviewed. Patient stable, with no distress noted at this time.
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
## 2018-04-01 NOTE — ED Notes (Signed)
ED Provider at bedside. 
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
## 2019-03-02 ENCOUNTER — Ambulatory Visit: Payer: PRIVATE HEALTH INSURANCE | Admitting: Family Medicine

## 2019-03-17 ENCOUNTER — Other Ambulatory Visit: Payer: Self-pay

## 2019-03-20 ENCOUNTER — Ambulatory Visit (INDEPENDENT_AMBULATORY_CARE_PROVIDER_SITE_OTHER): Payer: PRIVATE HEALTH INSURANCE | Admitting: Family Medicine

## 2019-03-20 ENCOUNTER — Other Ambulatory Visit: Payer: Self-pay

## 2019-03-20 ENCOUNTER — Encounter: Payer: Self-pay | Admitting: Family Medicine

## 2019-03-20 VITALS — BP 135/81 | HR 89 | Ht 71.77 in | Wt 284.0 lb

## 2019-03-20 DIAGNOSIS — Z23 Encounter for immunization: Secondary | ICD-10-CM

## 2019-03-20 DIAGNOSIS — Z0000 Encounter for general adult medical examination without abnormal findings: Secondary | ICD-10-CM | POA: Diagnosis not present

## 2019-03-20 DIAGNOSIS — F419 Anxiety disorder, unspecified: Secondary | ICD-10-CM

## 2019-03-20 LAB — UA/M W/RFLX CULTURE, ROUTINE
Bilirubin, UA: NEGATIVE
Glucose, UA: NEGATIVE
Leukocytes,UA: NEGATIVE
Nitrite, UA: NEGATIVE
Protein,UA: NEGATIVE
RBC, UA: NEGATIVE
Specific Gravity, UA: >1.03 — ABNORMAL HIGH (ref 1.005–1.030)
Urobilinogen, Ur: 0.2 mg/dL (ref 0.2–1.0)
pH, UA: 5.5 (ref 5.0–7.5)

## 2019-03-20 MED ORDER — SERTRALINE HCL 100 MG PO TABS: 150.0000 mg | ORAL_TABLET | Freq: Every day | ORAL | 3 refills | Status: DC

## 2019-03-20 NOTE — Progress Notes (Signed)
BP 135/81   Pulse 89   Ht 5' 11.77" (1.823 m)   Wt 284 lb (128.8 kg)   SpO2 98%   BMI 38.76 kg/m    Subjective:    Patient ID: Andrew Mccormick, male    DOB: 07-Nov-1990, 28 y.o.   MRN: [id number]  HPI: Andrew Mccormick is a 28 y.o. male presenting on 03/20/2019 for comprehensive medical examination. Current medical complaints include:see below  Moods continue to be stable with zoloft regimen. Taking daily without side effects. Denies SI/HI, severe mood swings, anhedonia.   No new concerns today.  He currently lives with: Interim Problems from his last visit: no  Depression Screen done today and results listed below:  [facility] 03/20/2019 03/20/2018 12/24/2017 10/30/2016 10/24/2015  Decreased Interest 0 0 0 0 0  Down, Depressed, Hopeless 0 0 0 0 0  PHQ - 2 Score 0 0 0 0 0  Altered sleeping - - 0 - -  Tired, decreased energy - - 1 - -  Change in appetite - - 1 - -  Feeling bad or failure about yourself  - - 0 - -  Trouble concentrating - - 0 - -  Moving slowly or fidgety/restless - - 0 - -  Suicidal thoughts - - 0 - -  PHQ-9 Score - - 2 - -    The patient does not have a history of falls. I did complete a risk assessment for falls. A plan of care for falls was documented.   Past Medical History:  Past Medical History:  Diagnosis Date  . Anxiety     Surgical History:  Past Surgical History:  Procedure Laterality Date  . WISDOM TOOTH EXTRACTION      Medications:  No current outpatient medications on file prior to visit.   No current facility-administered medications on file prior to visit.     Allergies:  No Known Allergies  Social History:  Social History   Socioeconomic History  . Marital status: Single    Spouse name: Not on file  . Number of children: Not on file  . Years of education: Not on file  . Highest education level: Not on file  Occupational History  . Not on file  Social Needs  . Financial resource strain: Not on file  . Food  insecurity    Worry: Not on file    Inability: Not on file  . Transportation needs    Medical: Not on file    Non-medical: Not on file  Tobacco Use  . Smoking status: Never Smoker  . Smokeless tobacco: Never Used  Substance and Sexual Activity  . Alcohol use: No  . Drug use: No  . Sexual activity: Not on file  Lifestyle  . Physical activity    Days per week: Not on file    Minutes per session: Not on file  . Stress: Not on file  Relationships  . Social Musicianconnections    Talks on phone: Not on file    Gets together: Not on file    Attends religious service: Not on file    Active member of club or organization: Not on file    Attends meetings of clubs or organizations: Not on file    Relationship status: Not on file  . Intimate partner violence    Fear of current or ex partner: Not on file    Emotionally abused: Not on file    Physically abused: Not on file    Forced  sexual activity: Not on file  Other Topics Concern  . Not on file  Social History Narrative  . Not on file   Social History   Tobacco Use  Smoking Status Never Smoker  Smokeless Tobacco Never Used   Social History   Substance and Sexual Activity  Alcohol Use No    Family History:  Family History  Problem Relation Age of Onset  . Healthy Mother   . Healthy Father   . Heart disease Paternal Grandfather   . Cancer Maternal Aunt   . COPD Neg Hx   . Diabetes Neg Hx   . Stroke Neg Hx     Past medical history, surgical history, medications, allergies, family history and social history reviewed with patient today and changes made to appropriate areas of the chart.   Review of Systems - General ROS: negative Psychological ROS: negative Ophthalmic ROS: negative ENT ROS: negative Allergy and Immunology ROS: negative Hematological and Lymphatic ROS: negative Endocrine ROS: negative Breast ROS: negative for breast lumps Respiratory ROS: no cough, shortness of breath, or wheezing Cardiovascular ROS: no  chest pain or dyspnea on exertion Gastrointestinal ROS: no abdominal pain, change in bowel habits, or black or bloody stools Genito-Urinary ROS: no dysuria, trouble voiding, or hematuria Musculoskeletal ROS: negative Neurological ROS: no TIA or stroke symptoms Dermatological ROS: negative All other ROS negative except what is listed above and in the HPI.      Objective:    BP 135/81   Pulse 89   Ht 5' 11.77" (1.823 m)   Wt 284 lb (128.8 kg)   SpO2 98%   BMI 38.76 kg/m   Wt Readings from Last 3 Encounters:  03/20/19 284 lb (128.8 kg)  04/01/18 270 lb (122.5 kg)  03/17/18 281 lb (127.5 kg)    Physical Exam Vitals signs and nursing note reviewed.  Constitutional:      General: He is not in acute distress.    Appearance: He is well-developed.  HENT:     Head: Atraumatic.     Right Ear: Tympanic membrane and external ear normal.     Left Ear: Tympanic membrane and external ear normal.     Nose: Nose normal.     Mouth/Throat:     Mouth: Mucous membranes are moist.     Pharynx: Oropharynx is clear.  Eyes:     General: No scleral icterus.    Conjunctiva/sclera: Conjunctivae normal.     Pupils: Pupils are equal, round, and reactive to light.  Neck:     Musculoskeletal: Normal range of motion and neck supple.  Cardiovascular:     Rate and Rhythm: Normal rate and regular rhythm.     Heart sounds: Normal heart sounds. No murmur.  Pulmonary:     Effort: Pulmonary effort is normal. No respiratory distress.     Breath sounds: Normal breath sounds.  Abdominal:     General: Bowel sounds are normal. There is no distension.     Palpations: Abdomen is soft. There is no mass.     Tenderness: There is no abdominal tenderness. There is no guarding.  Genitourinary:    Comments: GU exam declined Musculoskeletal: Normal range of motion.        General: No tenderness.  Skin:    General: Skin is warm and dry.     Findings: No rash.  Neurological:     General: No focal deficit present.      Mental Status: He is alert and oriented to person, place, and  time.     Deep Tendon Reflexes: Reflexes are normal and symmetric.  Psychiatric:        Mood and Affect: Mood normal.        Behavior: Behavior normal.        Thought Content: Thought content normal.        Judgment: Judgment normal.     Results for orders placed or performed in visit on 03/17/18  CBC with Differential/Platelet  Result Value Ref Range   WBC 7.1 3.4 - 10.8 x10E3/uL   RBC 5.25 4.14 - 5.80 x10E6/uL   Hemoglobin 13.5 13.0 - 17.7 g/dL   Hematocrit 14.7 82.9 - 51.0 %   MCV 76 (L) 79 - 97 fL   MCH 25.7 (L) 26.6 - 33.0 pg   MCHC 33.8 31.5 - 35.7 g/dL   RDW 56.2 13.0 - 86.5 %   Platelets 319 150 - 450 x10E3/uL   Neutrophils 47 Not Estab. %   Lymphs 44 Not Estab. %   Monocytes 6 Not Estab. %   Eos 2 Not Estab. %   Basos 1 Not Estab. %   Neutrophils Absolute 3.3 1.4 - 7.0 x10E3/uL   Lymphocytes Absolute 3.1 0.7 - 3.1 x10E3/uL   Monocytes Absolute 0.4 0.1 - 0.9 x10E3/uL   EOS (ABSOLUTE) 0.1 0.0 - 0.4 x10E3/uL   Basophils Absolute 0.0 0.0 - 0.2 x10E3/uL   Immature Granulocytes 0 Not Estab. %   Immature Grans (Abs) 0.0 0.0 - 0.1 x10E3/uL  Comprehensive metabolic panel  Result Value Ref Range   Glucose 92 65 - 99 mg/dL   BUN 11 6 - 20 mg/dL   Creatinine, Ser 7.84 0.76 - 1.27 mg/dL   GFR calc non Af Amer 121 >59 mL/min/1.73   GFR calc Af Amer 140 >59 mL/min/1.73   BUN/Creatinine Ratio 13 9 - 20   Sodium 141 134 - 144 mmol/L   Potassium 4.1 3.5 - 5.2 mmol/L   Chloride 100 96 - 106 mmol/L   CO2 26 20 - 29 mmol/L   Calcium 9.8 8.7 - 10.2 mg/dL   Total Protein 8.0 6.0 - 8.5 g/dL   Albumin 5.5 3.5 - 5.5 g/dL   Globulin, Total 2.5 1.5 - 4.5 g/dL   Albumin/Globulin Ratio 2.2 1.2 - 2.2   Bilirubin Total 0.3 0.0 - 1.2 mg/dL   Alkaline Phosphatase 53 39 - 117 IU/L   AST 27 0 - 40 IU/L   ALT 36 0 - 44 IU/L  Lipid Panel w/o Chol/HDL Ratio  Result Value Ref Range   Cholesterol, Total 192 100 - 199 mg/dL    Triglycerides 696 (H) 0 - 149 mg/dL   HDL 33 (L) >29 mg/dL   VLDL Cholesterol Cal Comment 5 - 40 mg/dL   LDL Calculated Comment 0 - 99 mg/dL  UA/M w/rflx Culture, Routine   Specimen: Urine   URINE  Result Value Ref Range   Specific Gravity, UA 1.025 1.005 - 1.030   pH, UA 6.5 5.0 - 7.5   Color, UA Yellow Yellow   Appearance Ur Hazy (A) Clear   Leukocytes, UA Negative Negative   Protein, UA Negative Negative/Trace   Glucose, UA Negative Negative   Ketones, UA Negative Negative   RBC, UA Negative Negative   Bilirubin, UA Negative Negative   Urobilinogen, Ur 0.2 0.2 - 1.0 mg/dL   Nitrite, UA Negative Negative      Assessment & Plan:   Problem List Items Addressed This Visit      Other  Anxiety - Primary    Stable and under good control, continue current regimen      Relevant Medications   sertraline (ZOLOFT) 100 MG tablet    Other Visit Diagnoses    Annual physical exam       Relevant Orders   CBC with Differential/Platelet   Comprehensive metabolic panel   Lipid Panel w/o Chol/HDL Ratio   TSH   UA/M w/rflx Culture, Routine   Immunization due       Relevant Orders   Flu Vaccine QUAD 6+ mos PF IM (Fluarix Quad PF) (Completed)       Discussed aspirin prophylaxis for myocardial infarction prevention and decision was it was not indicated  LABORATORY TESTING:  Health maintenance labs ordered today as discussed above.   The natural history of prostate cancer and ongoing controversy regarding screening and potential treatment outcomes of prostate cancer has been discussed with the patient. The meaning of a false positive PSA and a false negative PSA has been discussed. He indicates understanding of the limitations of this screening test and wishes not to proceed with screening PSA testing.   IMMUNIZATIONS:   - Tdap: Tetanus vaccination status reviewed: last tetanus booster within 10 years. - Influenza: Administered today  PATIENT COUNSELING:    Sexuality:  Discussed sexually transmitted diseases, partner selection, use of condoms, avoidance of unintended pregnancy  and contraceptive alternatives.   Advised to avoid cigarette smoking.  I discussed with the patient that most people either abstain from alcohol or drink within safe limits (<=14/week and <=4 drinks/occasion for males, <=7/weeks and <= 3 drinks/occasion for females) and that the risk for alcohol disorders and other health effects rises proportionally with the number of drinks per week and how often a drinker exceeds daily limits.  Discussed cessation/primary prevention of drug use and availability of treatment for abuse.   Diet: Encouraged to adjust caloric intake to maintain  or achieve ideal body weight, to reduce intake of dietary saturated fat and total fat, to limit sodium intake by avoiding high sodium foods and not adding table salt, and to maintain adequate dietary potassium and calcium preferably from fresh fruits, vegetables, and low-fat dairy products.    stressed the importance of regular exercise  Injury prevention: Discussed safety belts, safety helmets, smoke detector, smoking near bedding or upholstery.   Dental health: Discussed importance of regular tooth brushing, flossing, and dental visits.   Follow up plan: NEXT PREVENTATIVE PHYSICAL DUE IN 1 YEAR. Return in about 1 year (around 03/19/2020) for CPE.

## 2019-03-20 NOTE — Assessment & Plan Note (Signed)
Stable and under good control, continue current regimen 

## 2019-03-21 LAB — TSH: TSH: 3.52 u[IU]/mL (ref 0.450–4.500)

## 2019-03-21 LAB — COMPREHENSIVE METABOLIC PANEL
ALT: 53 IU/L — ABNORMAL HIGH (ref 0–44)
AST: 30 IU/L (ref 0–40)
Albumin/Globulin Ratio: 1.8 (ref 1.2–2.2)
Albumin: 4.9 g/dL (ref 4.1–5.2)
Alkaline Phosphatase: 57 IU/L (ref 39–117)
BUN/Creatinine Ratio: 10 (ref 9–20)
BUN: 10 mg/dL (ref 6–20)
Bilirubin Total: 0.4 mg/dL (ref 0.0–1.2)
CO2: 23 mmol/L (ref 20–29)
Calcium: 9.6 mg/dL (ref 8.7–10.2)
Chloride: 102 mmol/L (ref 96–106)
Creatinine, Ser: 0.98 mg/dL (ref 0.76–1.27)
GFR calc Af Amer: 123 mL/min/{1.73_m2} (ref >59)
GFR calc non Af Amer: 106 mL/min/{1.73_m2} (ref >59)
Globulin, Total: 2.8 g/dL (ref 1.5–4.5)
Glucose: 96 mg/dL (ref 65–99)
Potassium: 4 mmol/L (ref 3.5–5.2)
Sodium: 141 mmol/L (ref 134–144)
Total Protein: 7.7 g/dL (ref 6.0–8.5)

## 2019-03-21 LAB — CBC WITH DIFFERENTIAL/PLATELET
Basophils Absolute: 0 10*3/uL (ref 0.0–0.2)
Basos: 1 %
EOS (ABSOLUTE): 0.1 10*3/uL (ref 0.0–0.4)
Eos: 2 %
Hematocrit: 40.8 % (ref 37.5–51.0)
Hemoglobin: 13.4 g/dL (ref 13.0–17.7)
Immature Grans (Abs): 0 10*3/uL (ref 0.0–0.1)
Immature Granulocytes: 0 %
Lymphocytes Absolute: 2.3 10*3/uL (ref 0.7–3.1)
Lymphs: 40 %
MCH: 25.4 pg — ABNORMAL LOW (ref 26.6–33.0)
MCHC: 32.8 g/dL (ref 31.5–35.7)
MCV: 77 fL — ABNORMAL LOW (ref 79–97)
Monocytes Absolute: 0.3 10*3/uL (ref 0.1–0.9)
Monocytes: 6 %
Neutrophils Absolute: 2.9 10*3/uL (ref 1.4–7.0)
Neutrophils: 51 %
Platelets: 270 10*3/uL (ref 150–450)
RBC: 5.28 x10E6/uL (ref 4.14–5.80)
RDW: 14 % (ref 11.6–15.4)
WBC: 5.6 10*3/uL (ref 3.4–10.8)

## 2019-03-21 LAB — LIPID PANEL W/O CHOL/HDL RATIO
Cholesterol, Total: 186 mg/dL (ref 100–199)
HDL: 36 mg/dL — ABNORMAL LOW (ref >39)
LDL Chol Calc (NIH): 105 mg/dL — ABNORMAL HIGH (ref 0–99)
Triglycerides: 264 mg/dL — ABNORMAL HIGH (ref 0–149)
VLDL Cholesterol Cal: 45 mg/dL — ABNORMAL HIGH (ref 5–40)

## 2020-01-15 ENCOUNTER — Other Ambulatory Visit: Payer: Self-pay | Admitting: Family Medicine

## 2020-01-22 ENCOUNTER — Ambulatory Visit: Payer: PRIVATE HEALTH INSURANCE | Admitting: Family Medicine

## 2020-01-22 ENCOUNTER — Encounter: Payer: PRIVATE HEALTH INSURANCE | Admitting: Family Medicine

## 2020-03-20 NOTE — Progress Notes (Signed)
BP 136/77    Pulse 76    Temp 98.2 F (36.8 C) (Oral)    Ht 5' 10.8" (1.798 m)    Wt 291 lb 9.6 oz (132.3 kg)    SpO2 98%    BMI 40.90 kg/m    Subjective:    Patient ID: Andrew Mccormick, male    DOB: 1991-02-10, 29 y.o.   MRN: [id number]  HPI: Andrew Mccormick is a 29 y.o. male presenting on 03/21/2020 for comprehensive medical examination. Current medical complaints include:none  No concerns today.  He currently lives with: girlfriend Interim Problems from his last visit: no  Depression Screen done today and results listed below:  Depression screen [facility] 2/9 03/21/2020 03/20/2019 03/20/2018 12/24/2017 10/30/2016  Decreased Interest 0 0 0 0 0  Down, Depressed, Hopeless 0 0 0 0 0  PHQ - 2 Score 0 0 0 0 0  Altered sleeping 0 - - 0 -  Tired, decreased energy 0 - - 1 -  Change in appetite 0 - - 1 -  Feeling bad or failure about yourself  0 - - 0 -  Trouble concentrating 0 - - 0 -  Moving slowly or fidgety/restless 0 - - 0 -  Suicidal thoughts 0 - - 0 -  PHQ-9 Score 0 - - 2 -  Difficult doing work/chores Not difficult at all - - - -   GAD 7 : Generalized Anxiety Score 03/21/2020 12/24/2017 10/24/2015  Nervous, Anxious, on Edge 0 1 0  Control/stop worrying 0 1 0  Worry too much - different things 0 1 0  Trouble relaxing 0 1 0  Restless 0 0 0  Easily annoyed or irritable 0 1 1  Afraid - awful might happen 0 0 0  Total GAD 7 Score 0 5 1  Anxiety Difficulty Not difficult at all - Not difficult at all      The patient does not have a history of falls. I did not complete a risk assessment for falls. A plan of care for falls was not documented.   Past Medical History:  Past Medical History:  Diagnosis Date   Anxiety     Surgical History:  Past Surgical History:  Procedure Laterality Date   WISDOM TOOTH EXTRACTION      Medications:  Current Outpatient Medications on File Prior to Visit  Medication Sig   sertraline (ZOLOFT) 100 MG tablet TAKE 1 & 1/2 (ONE & ONE-HALF) TABLETS BY  MOUTH ONCE DAILY   No current facility-administered medications on file prior to visit.    Allergies:  No Known Allergies  Social History:  Social History   Socioeconomic History   Marital status: Single    Spouse name: Not on file   Number of children: Not on file   Years of education: Not on file   Highest education level: Not on file  Occupational History   Not on file  Tobacco Use   Smoking status: Never Smoker   Smokeless tobacco: Never Used  Vaping Use   Vaping Use: Never used  Substance and Sexual Activity   Alcohol use: No   Drug use: No   Sexual activity: Yes  Other Topics Concern   Not on file  Social History Narrative   Not on file   Social Determinants of Health   Financial Resource Strain:    Difficulty of Paying Living Expenses: Not on file  Food Insecurity:    Worried About Running Out of Food in the Last Year: Not on  file   Ran Out of Food in the Last Year: Not on file  Transportation Needs:    Lack of Transportation (Medical): Not on file   Lack of Transportation (Non-Medical): Not on file  Physical Activity:    Days of Exercise per Week: Not on file   Minutes of Exercise per Session: Not on file  Stress:    Feeling of Stress : Not on file  Social Connections:    Frequency of Communication with Friends and Family: Not on file   Frequency of Social Gatherings with Friends and Family: Not on file   Attends Religious Services: Not on file   Active Member of Clubs or Organizations: Not on file   Attends Banker Meetings: Not on file   Marital Status: Not on file  Intimate Partner Violence:    Fear of Current or Ex-Partner: Not on file   Emotionally Abused: Not on file   Physically Abused: Not on file   Sexually Abused: Not on file   Social History   Tobacco Use  Smoking Status Never Smoker  Smokeless Tobacco Never Used   Social History   Substance and Sexual Activity  Alcohol Use No     Family History:  Family History  Problem Relation Age of Onset   Healthy Mother    Healthy Father    Heart disease Paternal Grandfather    Cancer Maternal Aunt    COPD Neg Hx    Diabetes Neg Hx    Stroke Neg Hx     Past medical history, surgical history, medications, allergies, family history and social history reviewed with patient today and changes made to appropriate areas of the chart.   Review of Systems  Constitutional: Negative.  Negative for diaphoresis, fever, malaise/fatigue and weight loss.  HENT: Negative.  Negative for ear pain, hearing loss, sore throat and tinnitus.   Eyes: Negative.  Negative for blurred vision, double vision, discharge and redness.  Respiratory: Negative.  Negative for cough, shortness of breath and wheezing.   Cardiovascular: Negative.  Negative for chest pain and leg swelling.  Gastrointestinal: Negative.  Negative for abdominal pain, blood in stool, diarrhea, heartburn, nausea and vomiting.  Genitourinary: Negative.  Negative for frequency and hematuria.  Musculoskeletal: Negative.  Negative for back pain, joint pain and myalgias.  Skin: Negative.  Negative for itching and rash.  Neurological: Negative.  Negative for dizziness, weakness and headaches.  Psychiatric/Behavioral: Negative.  Negative for depression, hallucinations and suicidal ideas. The patient is not nervous/anxious and does not have insomnia.     All other ROS negative except what is listed above and in the HPI.      Objective:    BP 136/77    Pulse 76    Temp 98.2 F (36.8 C) (Oral)    Ht 5' 10.8" (1.798 m)    Wt 291 lb 9.6 oz (132.3 kg)    SpO2 98%    BMI 40.90 kg/m   Wt Readings from Last 3 Encounters:  03/21/20 291 lb 9.6 oz (132.3 kg)  03/20/19 284 lb (128.8 kg)  04/01/18 270 lb (122.5 kg)    Physical Exam Vitals and nursing note reviewed.  Constitutional:      Appearance: Normal appearance. He is obese.  HENT:     Head: Normocephalic.     Right Ear:  Tympanic membrane, ear canal and external ear normal.     Left Ear: Tympanic membrane, ear canal and external ear normal.     Nose: Nose  normal. No congestion.     Mouth/Throat:     Mouth: Mucous membranes are moist.     Pharynx: Oropharynx is clear. No oropharyngeal exudate or posterior oropharyngeal erythema.  Eyes:     Extraocular Movements: Extraocular movements intact.     Conjunctiva/sclera: Conjunctivae normal.     Pupils: Pupils are equal, round, and reactive to light.  Neck:     Vascular: No carotid bruit.  Cardiovascular:     Rate and Rhythm: Normal rate and regular rhythm.     Heart sounds: Normal heart sounds. No murmur heard.  No gallop.   Pulmonary:     Effort: Pulmonary effort is normal. No respiratory distress.     Breath sounds: Normal breath sounds. No wheezing or rhonchi.  Abdominal:     General: Abdomen is flat. Bowel sounds are normal. There is no distension.     Palpations: Abdomen is soft.     Tenderness: There is no abdominal tenderness.  Genitourinary:    Comments: Deferred using shared decision making Musculoskeletal:        General: No swelling or tenderness. Normal range of motion.     Cervical back: Normal range of motion and neck supple. No tenderness.     Right lower leg: No edema.     Left lower leg: No edema.  Skin:    General: Skin is warm and dry.     Coloration: Skin is not jaundiced.     Findings: No lesion.  Neurological:     General: No focal deficit present.     Mental Status: He is alert and oriented to person, place, and time. Mental status is at baseline.  Psychiatric:        Mood and Affect: Mood normal.        Behavior: Behavior normal.        Thought Content: Thought content normal.        Judgment: Judgment normal.       Assessment & Plan:   Problem List Items Addressed This Visit      Other   Anxiety    Chronic, stable on sertraline 150 mg daily.  PHQ-9 and GAD-7 zero today.  NO SI/HI.  Will continue sertraline at  current dose.  Follow up in 6 months or sooner if needs arise.      Relevant Orders   TSH   CBC with Differential/Platelet   Comprehensive metabolic panel   BMI 40.0-44.9, adult (HCC)    Ongoing.  Discussed lifestyle changes like increasing physical activity with goal of physical activity 30 minutes 5 times weekly.  Will screen for diabetes today with HgbA1c.       Relevant Orders   HgB A1c   CBC with Differential/Platelet   Comprehensive metabolic panel    Other Visit Diagnoses    Annual physical exam    -  Primary   Relevant Orders   HgB A1c   Lipid Panel w/o Chol/HDL Ratio   TSH   CBC with Differential/Platelet   Comprehensive metabolic panel   Encounter for lipid screening for cardiovascular disease       Relevant Orders   Lipid Panel w/o Chol/HDL Ratio   Screening for thyroid disorder       Relevant Orders   TSH   Need for influenza vaccination       Relevant Orders   Flu Vaccine QUAD 36+ mos IM       Discussed aspirin prophylaxis for myocardial infarction prevention and decision was it  was not indicated  LABORATORY TESTING:  Health maintenance labs ordered today as discussed above.   IMMUNIZATIONS:   - Tdap: Tetanus vaccination status reviewed: last tetanus booster within 10 years. - Influenza: Administered today - Pneumovax: Not applicable - Prevnar: Not applicable - HPV: Not applicable - Zostavax vaccine: Not applicable  - COVID-19 vaccine: fully vaccinated end of January 2021 with Pfizer vaccine  SCREENING: - Colonoscopy: Not applicable  Discussed with patient purpose of the colonoscopy is to detect colon cancer at curable precancerous or early stages   - AAA Screening: Not applicable  -Hearing Test: Not applicable  -Spirometry: Not applicable   PATIENT COUNSELING:    Sexuality: Discussed sexually transmitted diseases, partner selection, use of condoms, avoidance of unintended pregnancy  and contraceptive alternatives.   Advised to avoid  cigarette smoking.  I discussed with the patient that most people either abstain from alcohol or drink within safe limits (<=14/week and <=4 drinks/occasion for males, <=7/weeks and <= 3 drinks/occasion for females) and that the risk for alcohol disorders and other health effects rises proportionally with the number of drinks per week and how often a drinker exceeds daily limits.  Discussed cessation/primary prevention of drug use and availability of treatment for abuse.   Diet: Encouraged to adjust caloric intake to maintain  or achieve ideal body weight, to reduce intake of dietary saturated fat and total fat, to limit sodium intake by avoiding high sodium foods and not adding table salt, and to maintain adequate dietary potassium and calcium preferably from fresh fruits, vegetables, and low-fat dairy products.    stressed the importance of regular exercise  Injury prevention: Discussed safety belts, safety helmets, smoke detector, smoking near bedding or upholstery.   Dental health: Discussed importance of regular tooth brushing, flossing, and dental visits.   Follow up plan: NEXT PREVENTATIVE PHYSICAL DUE IN 1 YEAR. Return in about 6 months (around 09/19/2020) for mood f/u.

## 2020-03-21 ENCOUNTER — Encounter: Payer: Self-pay | Admitting: Nurse Practitioner

## 2020-03-21 ENCOUNTER — Other Ambulatory Visit: Payer: Self-pay

## 2020-03-21 ENCOUNTER — Ambulatory Visit (INDEPENDENT_AMBULATORY_CARE_PROVIDER_SITE_OTHER): Payer: PRIVATE HEALTH INSURANCE | Admitting: Nurse Practitioner

## 2020-03-21 VITALS — BP 136/77 | HR 76 | Temp 98.2°F | Ht 70.8 in | Wt 291.6 lb

## 2020-03-21 DIAGNOSIS — F419 Anxiety disorder, unspecified: Secondary | ICD-10-CM

## 2020-03-21 DIAGNOSIS — Z1329 Encounter for screening for other suspected endocrine disorder: Secondary | ICD-10-CM

## 2020-03-21 DIAGNOSIS — R718 Other abnormality of red blood cells: Secondary | ICD-10-CM

## 2020-03-21 DIAGNOSIS — Z23 Encounter for immunization: Secondary | ICD-10-CM | POA: Diagnosis not present

## 2020-03-21 DIAGNOSIS — Z6841 Body Mass Index (BMI) 40.0 and over, adult: Secondary | ICD-10-CM | POA: Diagnosis not present

## 2020-03-21 DIAGNOSIS — Z0000 Encounter for general adult medical examination without abnormal findings: Secondary | ICD-10-CM

## 2020-03-21 DIAGNOSIS — Z13220 Encounter for screening for lipoid disorders: Secondary | ICD-10-CM

## 2020-03-21 DIAGNOSIS — Z136 Encounter for screening for cardiovascular disorders: Secondary | ICD-10-CM

## 2020-03-21 NOTE — Assessment & Plan Note (Signed)
Ongoing.  Discussed lifestyle changes like increasing physical activity with goal of physical activity 30 minutes 5 times weekly.  Will screen for diabetes today with HgbA1c.

## 2020-03-21 NOTE — Assessment & Plan Note (Signed)
Chronic, stable on sertraline 150 mg daily.  PHQ-9 and GAD-7 zero today.  NO SI/HI.  Will continue sertraline at current dose.  Follow up in 6 months or sooner if needs arise.

## 2020-03-21 NOTE — Patient Instructions (Addendum)
Influenza (Flu) Vaccine (Inactivated or Recombinant): What You Need to Know 1. Why get vaccinated? Influenza vaccine can prevent influenza (flu). Flu is a contagious disease that spreads around the [place] every year, usually between October and May. Anyone can get the flu, but it is more dangerous for some people. Infants and young children, people 27 years of age and older, pregnant women, and people with certain health conditions or a weakened immune system are at greatest risk of flu complications. Pneumonia, bronchitis, sinus infections and ear infections are examples of flu-related complications. If you have a medical condition, such as heart disease, cancer or diabetes, flu can make it worse. Flu can cause fever and chills, sore throat, muscle aches, fatigue, cough, headache, and runny or stuffy nose. Some people may have vomiting and diarrhea, though this is more common in children than adults. Each year thousands of people in the [place] die from flu, and many more are hospitalized. Flu vaccine prevents millions of illnesses and flu-related visits to the doctor each year. 2. Influenza vaccine CDC recommends everyone 53 months of age and older get vaccinated every flu season. Children 6 months through 91 years of age may need 2 doses during a single flu season. Everyone else needs only 1 dose each flu season. It takes about 2 weeks for protection to develop after vaccination. There are many flu viruses, and they are always changing. Each year a new flu vaccine is made to protect against three or four viruses that are likely to cause disease in the upcoming flu season. Even when the vaccine doesn't exactly match these viruses, it may still provide some protection. Influenza vaccine does not cause flu. Influenza vaccine may be given at the same time as other vaccines. 3. Talk with your health care provider Tell your vaccine provider if the person getting the vaccine:  Has had an  allergic reaction after a previous dose of influenza vaccine, or has any severe, life-threatening allergies.  Has ever had Guillain-Barr Syndrome (also called GBS). In some cases, your health care provider may decide to postpone influenza vaccination to a future visit. People with minor illnesses, such as a cold, may be vaccinated. People who are moderately or severely ill should usually wait until they recover before getting influenza vaccine. Your health care provider can give you more information. 4. Risks of a vaccine reaction  Soreness, redness, and swelling where shot is given, fever, muscle aches, and headache can happen after influenza vaccine.  There may be a very small increased risk of Guillain-Barr Syndrome (GBS) after inactivated influenza vaccine (the flu shot). Young children who get the flu shot along with pneumococcal vaccine (PCV13), and/or DTaP vaccine at the same time might be slightly more likely to have a seizure caused by fever. Tell your health care provider if a child who is getting flu vaccine has ever had a seizure. People sometimes faint after medical procedures, including vaccination. Tell your provider if you feel dizzy or have vision changes or ringing in the ears. As with any medicine, there is a very remote chance of a vaccine causing a severe allergic reaction, other serious injury, or death. 5. What if there is a serious problem? An allergic reaction could occur after the vaccinated person leaves the clinic. If you see signs of a severe allergic reaction (hives, swelling of the face and throat, difficulty breathing, a fast heartbeat, dizziness, or weakness), call 9-1-1 and get the person to the nearest hospital. For other signs that  concern you, call your health care provider. Adverse reactions should be reported to the Vaccine Adverse Event Reporting System (VAERS). Your health care provider will usually file this report, or you can do it yourself. Visit the  VAERS website at www.vaers.SamedayNews.es or call (440) 327-6814.VAERS is only for reporting reactions, and VAERS staff do not give medical advice. 6. The National Vaccine Injury Compensation Program The [employer] Vaccine Injury Compensation Program (VICP) is a federal program that was created to compensate people who may have been injured by certain vaccines. Visit the VICP website at GoldCloset.com.ee or call (901)282-6094 to learn about the program and about filing a claim. There is a time limit to file a claim for compensation. 7. How can I learn more?  Ask your healthcare provider.  Call your local or state health department.  Contact the Centers for Disease Control and Prevention (CDC): ? Call 678 662 4297 (1-800-CDC-INFO) or ? Visit CDC's https://gibson.com/ Vaccine Information Statement (Interim) Inactivated Influenza Vaccine (01/20/2018) This information is not intended to replace advice given to you by your health care provider. Make sure you discuss any questions you have with your health care provider. Document Revised: 09/13/2018 Document Reviewed: 01/24/2018 Elsevier Patient Education  [place] DASH stands for "Dietary Approaches to Stop Hypertension." The DASH eating plan is a healthy eating plan that has been shown to reduce high blood pressure (hypertension). It may also reduce your risk for type 2 diabetes, heart disease, and stroke. The DASH eating plan may also help with weight loss. What are tips for following this plan?  General guidelines  Avoid eating more than 2,300 mg (milligrams) of salt (sodium) a day. If you have hypertension, you may need to reduce your sodium intake to 1,500 mg a day.  Limit alcohol intake to no more than 1 drink a day for nonpregnant women and 2 drinks a day for men. One drink equals 12 oz of beer, 5 oz of wine, or 1 oz of hard liquor.  Work with your health care provider to maintain a healthy body weight  or to lose weight. Ask what an ideal weight is for you.  Get at least 30 minutes of exercise that causes your heart to beat faster (aerobic exercise) most days of the week. Activities may include walking, swimming, or biking.  Work with your health care provider or diet and nutrition specialist (dietitian) to adjust your eating plan to your individual calorie needs. Reading food labels   Check food labels for the amount of sodium per serving. Choose foods with less than 5 percent of the Daily Value of sodium. Generally, foods with less than 300 mg of sodium per serving fit into this eating plan.  To find whole grains, look for the word "whole" as the first word in the ingredient list. Shopping  Buy products labeled as "low-sodium" or "no salt added."  Buy fresh foods. Avoid canned foods and premade or frozen meals. Cooking  Avoid adding salt when cooking. Use salt-free seasonings or herbs instead of table salt or sea salt. Check with your health care provider or pharmacist before using salt substitutes.  Do not fry foods. Cook foods using healthy methods such as baking, boiling, grilling, and broiling instead.  Cook with heart-healthy oils, such as olive, canola, soybean, or sunflower oil. Meal planning  Eat a balanced diet that includes: ? 5 or more servings of fruits and vegetables each day. At each meal, try to fill half of your plate with fruits  and vegetables. ? Up to 6-8 servings of whole grains each day. ? Less than 6 oz of lean meat, poultry, or fish each day. A 3-oz serving of meat is about the same size as a deck of cards. One egg equals 1 oz. ? 2 servings of low-fat dairy each day. ? A serving of nuts, seeds, or beans 5 times each week. ? Heart-healthy fats. Healthy fats called Omega-3 fatty acids are found in foods such as flaxseeds and coldwater fish, like sardines, salmon, and mackerel.  Limit how much you eat of the following: ? Canned or prepackaged foods. ? Food  that is high in trans fat, such as fried foods. ? Food that is high in saturated fat, such as fatty meat. ? Sweets, desserts, sugary drinks, and other foods with added sugar. ? Full-fat dairy products.  Do not salt foods before eating.  Try to eat at least 2 vegetarian meals each week.  Eat more home-cooked food and less restaurant, buffet, and fast food.  When eating at a restaurant, ask that your food be prepared with less salt or no salt, if possible. What foods are recommended? The items listed may not be a complete list. Talk with your dietitian about what dietary choices are best for you. Grains Whole-grain or whole-wheat bread. Whole-grain or whole-wheat pasta. Brown rice. [place]. Bulgur. Whole-grain and low-sodium cereals. Pita bread. Low-fat, low-sodium crackers. Whole-wheat flour tortillas. Vegetables Fresh or frozen vegetables (raw, steamed, roasted, or grilled). Low-sodium or reduced-sodium tomato and vegetable juice. Low-sodium or reduced-sodium tomato sauce and tomato paste. Low-sodium or reduced-sodium canned vegetables. Fruits All fresh, dried, or frozen fruit. Canned fruit in natural juice (without added sugar). Meat and other protein foods Skinless chicken or [place]. Ground chicken or [place]. Pork with fat trimmed off. Fish and seafood. Egg whites. Dried beans, peas, or lentils. Unsalted nuts, nut butters, and seeds. Unsalted canned beans. Lean cuts of beef with fat trimmed off. Low-sodium, lean deli meat. Dairy Low-fat (1%) or fat-free (skim) milk. Fat-free, low-fat, or reduced-fat cheeses. Nonfat, low-sodium ricotta or cottage cheese. Low-fat or nonfat yogurt. Low-fat, low-sodium cheese. Fats and oils Soft margarine without trans fats. Vegetable oil. Low-fat, reduced-fat, or light mayonnaise and salad dressings (reduced-sodium). Canola, safflower, olive, soybean, and sunflower oils. Avocado. Seasoning and other foods Herbs. Spices. Seasoning mixes without salt.  Unsalted popcorn and pretzels. Fat-free sweets. What foods are not recommended? The items listed may not be a complete list. Talk with your dietitian about what dietary choices are best for you. Grains Baked goods made with fat, such as croissants, muffins, or some breads. Dry pasta or rice meal packs. Vegetables Creamed or fried vegetables. Vegetables in a cheese sauce. Regular canned vegetables (not low-sodium or reduced-sodium). Regular canned tomato sauce and paste (not low-sodium or reduced-sodium). Regular tomato and vegetable juice (not low-sodium or reduced-sodium). Angie Fava. Olives. Fruits Canned fruit in a light or heavy syrup. Fried fruit. Fruit in cream or butter sauce. Meat and other protein foods Fatty cuts of meat. Ribs. Fried meat. Berniece Salines. Sausage. Bologna and other processed lunch meats. Salami. Fatback. Hotdogs. Bratwurst. Salted nuts and seeds. Canned beans with added salt. Canned or smoked fish. Whole eggs or egg yolks. Chicken or [place] with skin. Dairy Whole or 2% milk, cream, and half-and-half. Whole or full-fat cream cheese. Whole-fat or sweetened yogurt. Full-fat cheese. Nondairy creamers. Whipped toppings. Processed cheese and cheese spreads. Fats and oils Butter. Stick margarine. Lard. Shortening. Ghee. Bacon fat. Tropical oils, such as coconut, palm kernel,  or palm oil. Seasoning and other foods Salted popcorn and pretzels. Onion salt, garlic salt, seasoned salt, table salt, and sea salt. Worcestershire sauce. Tartar sauce. Barbecue sauce. Teriyaki sauce. Soy sauce, including reduced-sodium. Steak sauce. Canned and packaged gravies. Fish sauce. Oyster sauce. Cocktail sauce. Horseradish that you find on the shelf. Ketchup. Mustard. Meat flavorings and tenderizers. Bouillon cubes. Hot sauce and Tabasco sauce. Premade or packaged marinades. Premade or packaged taco seasonings. Relishes. Regular salad dressings. Where to find more information:  National Heart, Lung, and [place]: https://wilson-eaton.com/  American Heart Association: www.heart.org Summary  The DASH eating plan is a healthy eating plan that has been shown to reduce high blood pressure (hypertension). It may also reduce your risk for type 2 diabetes, heart disease, and stroke.  With the DASH eating plan, you should limit salt (sodium) intake to 2,300 mg a day. If you have hypertension, you may need to reduce your sodium intake to 1,500 mg a day.  When on the DASH eating plan, aim to eat more fresh fruits and vegetables, whole grains, lean proteins, low-fat dairy, and heart-healthy fats.  Work with your health care provider or diet and nutrition specialist (dietitian) to adjust your eating plan to your individual calorie needs. This information is not intended to replace advice given to you by your health care provider. Make sure you discuss any questions you have with your health care provider. Document Revised: 05/07/2017 Document Reviewed: 05/18/2016 Elsevier Patient Education  2020 [employer] 47-50 Years Old, Male Preventive care refers to lifestyle choices and visits with your health care provider that can promote health and wellness. This includes:  A yearly physical exam. This is also called an annual well check.  Regular dental and eye exams.  Immunizations.  Screening for certain conditions.  Healthy lifestyle choices, such as eating a healthy diet, getting regular exercise, not using drugs or products that contain nicotine and tobacco, and limiting alcohol use. What can I expect for my preventive care visit? Physical exam Your health care provider will check:  Height and weight. These may be used to calculate body mass index (BMI), which is a measurement that tells if you are at a healthy weight.  Heart rate and blood pressure.  Your skin for abnormal spots. Counseling Your health care provider may ask you questions about:  Alcohol, tobacco, and drug  use.  Emotional well-being.  Home and relationship well-being.  Sexual activity.  Eating habits.  Work and work Statistician. What immunizations do I need?  Influenza (flu) vaccine  This is recommended every year. Tetanus, diphtheria, and pertussis (Tdap) vaccine  You may need a Td booster every 10 years. Varicella (chickenpox) vaccine  You may need this vaccine if you have not already been vaccinated. Human papillomavirus (HPV) vaccine  If recommended by your health care provider, you may need three doses over 6 months. Measles, mumps, and rubella (MMR) vaccine  You may need at least one dose of MMR. You may also need a second dose. Meningococcal conjugate (MenACWY) vaccine  One dose is recommended if you are 67-61 years old and a Market researcher living in a residence hall, or if you have one of several medical conditions. You may also need additional booster doses. Pneumococcal conjugate (PCV13) vaccine  You may need this if you have certain conditions and were not previously vaccinated. Pneumococcal polysaccharide (PPSV23) vaccine  You may need one or two doses if you smoke cigarettes or if you have  certain conditions. Hepatitis A vaccine  You may need this if you have certain conditions or if you travel or work in places where you may be exposed to hepatitis A. Hepatitis B vaccine  You may need this if you have certain conditions or if you travel or work in places where you may be exposed to hepatitis B. Haemophilus influenzae type b (Hib) vaccine  You may need this if you have certain risk factors. You may receive vaccines as individual doses or as more than one vaccine together in one shot (combination vaccines). Talk with your health care provider about the risks and benefits of combination vaccines. What tests do I need? Blood tests  Lipid and cholesterol levels. These may be checked every 5 years starting at age 22.  Hepatitis C  test.  Hepatitis B test. Screening   Diabetes screening. This is done by checking your blood sugar (glucose) after you have not eaten for a while (fasting).  Sexually transmitted disease (STD) testing. Talk with your health care provider about your test results, treatment options, and if necessary, the need for more tests. Follow these instructions at home: Eating and drinking   Eat a diet that includes fresh fruits and vegetables, whole grains, lean protein, and low-fat dairy products.  Take vitamin and mineral supplements as recommended by your health care provider.  Do not drink alcohol if your health care provider tells you not to drink.  If you drink alcohol: ? Limit how much you have to 0-2 drinks a day. ? Be aware of how much alcohol is in your drink. In the [place] one drink equals one 12 oz bottle of beer (355 mL), one 5 oz glass of wine (148 mL), or one 1 oz glass of hard liquor (44 mL). Lifestyle  Take daily care of your teeth and gums.  Stay active. Exercise for at least 30 minutes on 5 or more days each week.  Do not use any products that contain nicotine or tobacco, such as cigarettes, e-cigarettes, and chewing tobacco. If you need help quitting, ask your health care provider.  If you are sexually active, practice safe sex. Use a condom or other form of protection to prevent STIs (sexually transmitted infections). What's next?  Go to your health care provider once a year for a well check visit.  Ask your health care provider how often you should have your eyes and teeth checked.  Stay up to date on all vaccines. This information is not intended to replace advice given to you by your health care provider. Make sure you discuss any questions you have with your health care provider. Document Revised: 05/19/2018 Document Reviewed: 05/19/2018 Elsevier Patient Education  2020 [employer].

## 2020-03-22 LAB — LIPID PANEL W/O CHOL/HDL RATIO
Cholesterol, Total: 179 mg/dL (ref 100–199)
HDL: 35 mg/dL — ABNORMAL LOW (ref >39)
LDL Chol Calc (NIH): 117 mg/dL — ABNORMAL HIGH (ref 0–99)
Triglycerides: 148 mg/dL (ref 0–149)
VLDL Cholesterol Cal: 27 mg/dL (ref 5–40)

## 2020-03-22 LAB — COMPREHENSIVE METABOLIC PANEL
ALT: 44 IU/L (ref 0–44)
AST: 23 IU/L (ref 0–40)
Albumin/Globulin Ratio: 2 (ref 1.2–2.2)
Albumin: 5.1 g/dL (ref 4.1–5.2)
Alkaline Phosphatase: 54 IU/L (ref 44–121)
BUN/Creatinine Ratio: 13 (ref 9–20)
BUN: 11 mg/dL (ref 6–20)
Bilirubin Total: 0.3 mg/dL (ref 0.0–1.2)
CO2: 22 mmol/L (ref 20–29)
Calcium: 9.6 mg/dL (ref 8.7–10.2)
Chloride: 104 mmol/L (ref 96–106)
Creatinine, Ser: 0.82 mg/dL (ref 0.76–1.27)
GFR calc Af Amer: 140 mL/min/{1.73_m2} (ref >59)
GFR calc non Af Amer: 121 mL/min/{1.73_m2} (ref >59)
Globulin, Total: 2.6 g/dL (ref 1.5–4.5)
Glucose: 102 mg/dL — ABNORMAL HIGH (ref 65–99)
Potassium: 4.4 mmol/L (ref 3.5–5.2)
Sodium: 139 mmol/L (ref 134–144)
Total Protein: 7.7 g/dL (ref 6.0–8.5)

## 2020-03-22 LAB — CBC WITH DIFFERENTIAL/PLATELET
Basophils Absolute: 0 10*3/uL (ref 0.0–0.2)
Basos: 1 %
EOS (ABSOLUTE): 0.1 10*3/uL (ref 0.0–0.4)
Eos: 2 %
Hematocrit: 38.3 % (ref 37.5–51.0)
Hemoglobin: 12.9 g/dL — ABNORMAL LOW (ref 13.0–17.7)
Immature Grans (Abs): 0 10*3/uL (ref 0.0–0.1)
Immature Granulocytes: 0 %
Lymphocytes Absolute: 2 10*3/uL (ref 0.7–3.1)
Lymphs: 38 %
MCH: 25.7 pg — ABNORMAL LOW (ref 26.6–33.0)
MCHC: 33.7 g/dL (ref 31.5–35.7)
MCV: 76 fL — ABNORMAL LOW (ref 79–97)
Monocytes Absolute: 0.3 10*3/uL (ref 0.1–0.9)
Monocytes: 6 %
Neutrophils Absolute: 2.9 10*3/uL (ref 1.4–7.0)
Neutrophils: 53 %
Platelets: 250 10*3/uL (ref 150–450)
RBC: 5.01 x10E6/uL (ref 4.14–5.80)
RDW: 14.7 % (ref 11.6–15.4)
WBC: 5.4 10*3/uL (ref 3.4–10.8)

## 2020-03-22 LAB — HEMOGLOBIN A1C
Est. average glucose Bld gHb Est-mCnc: 114 mg/dL
Hgb A1c MFr Bld: 5.6 % (ref 4.8–5.6)

## 2020-03-22 LAB — TSH: TSH: 2.3 u[IU]/mL (ref 0.450–4.500)

## 2020-03-22 NOTE — Addendum Note (Signed)
Addended by: Cathlean Marseilles A on: 03/22/2020 07:15 AM   Modules accepted: Orders

## 2020-03-25 LAB — ANEMIA PANEL
Ferritin: 24 ng/mL — ABNORMAL LOW (ref 30–400)
Hematocrit: 40.6 % (ref 37.5–51.0)
Iron Saturation: 15 % (ref 15–55)
Iron: 53 ug/dL (ref 38–169)
Retic Ct Pct: 1.7 % (ref 0.6–2.6)
Total Iron Binding Capacity: 352 ug/dL (ref 250–450)
UIBC: 299 ug/dL (ref 111–343)
Vitamin B-12: 493 pg/mL (ref 232–1245)

## 2020-03-25 LAB — SPECIMEN STATUS REPORT

## 2020-05-04 ENCOUNTER — Other Ambulatory Visit: Payer: Self-pay | Admitting: Family Medicine

## 2020-05-04 NOTE — Telephone Encounter (Signed)
Requested Prescriptions  Pending Prescriptions Disp Refills   sertraline (ZOLOFT) 100 MG tablet [Pharmacy Med Name: Sertraline HCl 100 MG Oral Tablet] 135 tablet 1    Sig: TAKE 1 & 1/2 (ONE & ONE-HALF) TABLETS BY MOUTH ONCE DAILY     Psychiatry:  Antidepressants - SSRI Passed - 05/04/2020  9:49 AM      Passed - Valid encounter within last 6 months    Recent Outpatient Visits          1 month ago Annual physical exam   [facility] Valentino Nose, NP   1 year ago Anxiety   [facility] Particia Nearing, [place]   2 years ago Anxiety   [facility] Roosvelt Maser [place], [place]   2 years ago Anxiety   [facility] Roosvelt Maser [place], [place]   3 years ago Encounter to establish care   [facility], [place], [place]

## 2021-05-25 NOTE — Progress Notes (Signed)
BP 127/76    Pulse 73    Temp 97.6 F (36.4 C) (Oral)    Ht 5' 11.5" (1.816 m)    Wt 295 lb 9.6 oz (134.1 kg)    SpO2 97%    BMI 40.65 kg/m    Subjective:    Patient ID: Andrew Mccormick, male    DOB: 1991-03-27, 30 y.o.   MRN: [id number]  HPI: Andrew Mccormick is a 30 y.o. male presenting on 05/26/2021 for comprehensive medical examination. Current medical complaints include:none  He currently lives with: Interim Problems from his last visit: no  ANXIETY Patient states his anxiety has improved.  Has stopped taking the Zoloft.  Does not feel like he needs it any longer.  Denies SI.    Denies HA, CP, SOB, dizziness, palpitations, visual changes, and lower extremity swelling.   Depression Screen done today and results listed below:  Depression screen [facility] 2/9 05/26/2021 03/21/2020 03/20/2019 03/20/2018 12/24/2017  Decreased Interest 0 0 0 0 0  Down, Depressed, Hopeless 0 0 0 0 0  PHQ - 2 Score 0 0 0 0 0  Altered sleeping 0 0 - - 0  Tired, decreased energy 0 0 - - 1  Change in appetite 0 0 - - 1  Feeling bad or failure about yourself  0 0 - - 0  Trouble concentrating 0 0 - - 0  Moving slowly or fidgety/restless 0 0 - - 0  Suicidal thoughts 0 0 - - 0  PHQ-9 Score 0 0 - - 2  Difficult doing work/chores Not difficult at all Not difficult at all - - -    The patient does not have a history of falls. I did complete a risk assessment for falls. A plan of care for falls was documented.   Past Medical History:  Past Medical History:  Diagnosis Date   Anxiety     Surgical History:  Past Surgical History:  Procedure Laterality Date   WISDOM TOOTH EXTRACTION      Medications:  No current outpatient medications on file prior to visit.   No current facility-administered medications on file prior to visit.    Allergies:  No Known Allergies  Social History:  Social History   Socioeconomic History   Marital status: Single    Spouse name: Not on file   Number of children: Not on  file   Years of education: Not on file   Highest education level: Not on file  Occupational History   Not on file  Tobacco Use   Smoking status: Never   Smokeless tobacco: Never  Vaping Use   Vaping Use: Never used  Substance and Sexual Activity   Alcohol use: No   Drug use: No   Sexual activity: Yes  Other Topics Concern   Not on file  Social History Narrative   Not on file   Social Determinants of Health   Financial Resource Strain: Not on file  Food Insecurity: Not on file  Transportation Needs: Not on file  Physical Activity: Not on file  Stress: Not on file  Social Connections: Not on file  Intimate Partner Violence: Not on file   Social History   Tobacco Use  Smoking Status Never  Smokeless Tobacco Never   Social History   Substance and Sexual Activity  Alcohol Use No    Family History:  Family History  Problem Relation Age of Onset   Healthy Mother    Healthy Father    Cancer Maternal Aunt  Heart disease Paternal Grandfather    COPD Neg Hx    Diabetes Neg Hx    Stroke Neg Hx     Past medical history, surgical history, medications, allergies, family history and social history reviewed with patient today and changes made to appropriate areas of the chart.   Review of Systems  Eyes:  Negative for blurred vision and double vision.  Respiratory:  Negative for shortness of breath.   Cardiovascular:  Negative for chest pain, palpitations and leg swelling.  Neurological:  Negative for dizziness and headaches.  All other ROS negative except what is listed above and in the HPI.      Objective:    BP 127/76    Pulse 73    Temp 97.6 F (36.4 C) (Oral)    Ht 5' 11.5" (1.816 m)    Wt 295 lb 9.6 oz (134.1 kg)    SpO2 97%    BMI 40.65 kg/m   Wt Readings from Last 3 Encounters:  05/26/21 295 lb 9.6 oz (134.1 kg)  03/21/20 291 lb 9.6 oz (132.3 kg)  03/20/19 284 lb (128.8 kg)    Physical Exam Vitals and nursing note reviewed.  Constitutional:       General: He is not in acute distress.    Appearance: Normal appearance. He is obese. He is not ill-appearing, toxic-appearing or diaphoretic.  HENT:     Head: Normocephalic.     Right Ear: Tympanic membrane, ear canal and external ear normal.     Left Ear: Tympanic membrane, ear canal and external ear normal.     Nose: Nose normal. No congestion or rhinorrhea.     Mouth/Throat:     Mouth: Mucous membranes are moist.  Eyes:     General:        Right eye: No discharge.        Left eye: No discharge.     Extraocular Movements: Extraocular movements intact.     Conjunctiva/sclera: Conjunctivae normal.     Pupils: Pupils are equal, round, and reactive to light.  Cardiovascular:     Rate and Rhythm: Normal rate and regular rhythm.     Heart sounds: No murmur heard. Pulmonary:     Effort: Pulmonary effort is normal. No respiratory distress.     Breath sounds: Normal breath sounds. No wheezing, rhonchi or rales.  Abdominal:     General: Abdomen is flat. Bowel sounds are normal. There is no distension.     Palpations: Abdomen is soft.     Tenderness: There is no abdominal tenderness. There is no guarding.  Musculoskeletal:     Cervical back: Normal range of motion and neck supple.  Skin:    General: Skin is warm and dry.     Capillary Refill: Capillary refill takes less than 2 seconds.  Neurological:     General: No focal deficit present.     Mental Status: He is alert and oriented to person, place, and time.     Cranial Nerves: No cranial nerve deficit.     Motor: No weakness.     Deep Tendon Reflexes: Reflexes normal.  Psychiatric:        Mood and Affect: Mood normal.        Behavior: Behavior normal.        Thought Content: Thought content normal.        Judgment: Judgment normal.    Results for orders placed or performed in visit on 03/21/20  HgB A1c  Result Value Ref  Range   Hgb A1c MFr Bld 5.6 4.8 - 5.6 %   Est. average glucose Bld gHb Est-mCnc 114 mg/dL  Lipid Panel w/o  Chol/HDL Ratio  Result Value Ref Range   Cholesterol, Total 179 100 - 199 mg/dL   Triglycerides 148 0 - 149 mg/dL   HDL 35 (L) >39 mg/dL   VLDL Cholesterol Cal 27 5 - 40 mg/dL   LDL Chol Calc (NIH) 117 (H) 0 - 99 mg/dL  TSH  Result Value Ref Range   TSH 2.300 0.450 - 4.500 uIU/mL  CBC with Differential/Platelet  Result Value Ref Range   WBC 5.4 3.4 - 10.8 x10E3/uL   RBC 5.01 4.14 - 5.80 x10E6/uL   Hemoglobin 12.9 (L) 13.0 - 17.7 g/dL   Hematocrit 38.3 37.5 - 51.0 %   MCV 76 (L) 79 - 97 fL   MCH 25.7 (L) 26.6 - 33.0 pg   MCHC 33.7 31.5 - 35.7 g/dL   RDW 14.7 11.6 - 15.4 %   Platelets 250 150 - 450 x10E3/uL   Neutrophils 53 Not Estab. %   Lymphs 38 Not Estab. %   Monocytes 6 Not Estab. %   Eos 2 Not Estab. %   Basos 1 Not Estab. %   Neutrophils Absolute 2.9 1.4 - 7.0 x10E3/uL   Lymphocytes Absolute 2.0 0.7 - 3.1 x10E3/uL   Monocytes Absolute 0.3 0.1 - 0.9 x10E3/uL   EOS (ABSOLUTE) 0.1 0.0 - 0.4 x10E3/uL   Basophils Absolute 0.0 0.0 - 0.2 x10E3/uL   Immature Granulocytes 0 Not Estab. %   Immature Grans (Abs) 0.0 0.0 - 0.1 x10E3/uL  Comprehensive metabolic panel  Result Value Ref Range   Glucose 102 (H) 65 - 99 mg/dL   BUN 11 6 - 20 mg/dL   Creatinine, Ser 0.82 0.76 - 1.27 mg/dL   GFR calc non Af Amer 121 >59 mL/min/1.73   GFR calc Af Amer 140 >59 mL/min/1.73   BUN/Creatinine Ratio 13 9 - 20   Sodium 139 134 - 144 mmol/L   Potassium 4.4 3.5 - 5.2 mmol/L   Chloride 104 96 - 106 mmol/L   CO2 22 20 - 29 mmol/L   Calcium 9.6 8.7 - 10.2 mg/dL   Total Protein 7.7 6.0 - 8.5 g/dL   Albumin 5.1 4.1 - 5.2 g/dL   Globulin, Total 2.6 1.5 - 4.5 g/dL   Albumin/Globulin Ratio 2.0 1.2 - 2.2   Bilirubin Total 0.3 0.0 - 1.2 mg/dL   Alkaline Phosphatase 54 44 - 121 IU/L   AST 23 0 - 40 IU/L   ALT 44 0 - 44 IU/L  Anemia panel  Result Value Ref Range   Total Iron Binding Capacity 352 250 - 450 ug/dL   UIBC 299 111 - 343 ug/dL   Iron 53 38 - 169 ug/dL   Iron Saturation 15 15 - 55 %    Vitamin B-12 493 232 - 1,245 pg/mL   Folate, Hemolysate CANCELED ng/mL   Hematocrit 40.6 37.5 - 51.0 %   Folate, RBC CANCELED ng/mL   Ferritin 24 (L) 30 - 400 ng/mL   Retic Ct Pct 1.7 0.6 - 2.6 %  Specimen status report  Result Value Ref Range   specimen status report Comment       Assessment & Plan:   Problem List Items Addressed This Visit       Other   Anxiety    Chronic. Well controlled without medication. Follow up if symptoms worsen or fail to improve.  BMI 40.0-44.9, adult ([place])    Recommend a healthy lifestyle through diet and exercise.        Other Visit Diagnoses     Annual physical exam    -  Primary   Health maintenance reviewed during visit. Labs ordered today.    Relevant Orders   TSH   Lipid panel   CBC with Differential/Platelet   Comprehensive metabolic panel   Urinalysis, Routine w reflex microscopic   Screening for ischemic heart disease       Relevant Orders   Lipid panel   Screening for HIV (human immunodeficiency virus)       Relevant Orders   HIV Antibody (routine testing w rflx)   Encounter for hepatitis C screening test for low risk patient       Relevant Orders   Hepatitis C Antibody        Discussed aspirin prophylaxis for myocardial infarction prevention and decision was it was not indicated  LABORATORY TESTING:  Health maintenance labs ordered today as discussed above.    IMMUNIZATIONS:   - Tdap: Tetanus vaccination status reviewed: last tetanus booster within 10 years. - Influenza: Up to date - Pneumovax: Not applicable - Prevnar: Not applicable - COVID: Not applicable - HPV: Discussed at visit today - Shingrix vaccine: Not applicable  SCREENING: - Colonoscopy: Not applicable  Discussed with patient purpose of the colonoscopy is to detect colon cancer at curable precancerous or early stages   - AAA Screening: Not applicable  -Hearing Test: Not applicable  -Spirometry: Not applicable   PATIENT COUNSELING:     Sexuality: Discussed sexually transmitted diseases, partner selection, use of condoms, avoidance of unintended pregnancy  and contraceptive alternatives.   Advised to avoid cigarette smoking.  I discussed with the patient that most people either abstain from alcohol or drink within safe limits (<=14/week and <=4 drinks/occasion for males, <=7/weeks and <= 3 drinks/occasion for females) and that the risk for alcohol disorders and other health effects rises proportionally with the number of drinks per week and how often a drinker exceeds daily limits.  Discussed cessation/primary prevention of drug use and availability of treatment for abuse.   Diet: Encouraged to adjust caloric intake to maintain  or achieve ideal body weight, to reduce intake of dietary saturated fat and total fat, to limit sodium intake by avoiding high sodium foods and not adding table salt, and to maintain adequate dietary potassium and calcium preferably from fresh fruits, vegetables, and low-fat dairy products.    stressed the importance of regular exercise  Injury prevention: Discussed safety belts, safety helmets, smoke detector, smoking near bedding or upholstery.   Dental health: Discussed importance of regular tooth brushing, flossing, and dental visits.   Follow up plan: NEXT PREVENTATIVE PHYSICAL DUE IN 1 YEAR. Return in about 1 month (around 06/26/2021) for Physical and Fasting labs.

## 2021-05-26 ENCOUNTER — Encounter: Payer: Self-pay | Admitting: Nurse Practitioner

## 2021-05-26 ENCOUNTER — Other Ambulatory Visit: Payer: Self-pay

## 2021-05-26 ENCOUNTER — Ambulatory Visit (INDEPENDENT_AMBULATORY_CARE_PROVIDER_SITE_OTHER): Payer: BC Managed Care – PPO | Admitting: Nurse Practitioner

## 2021-05-26 VITALS — BP 127/76 | HR 73 | Temp 97.6°F | Ht 71.5 in | Wt 295.6 lb

## 2021-05-26 DIAGNOSIS — Z136 Encounter for screening for cardiovascular disorders: Secondary | ICD-10-CM

## 2021-05-26 DIAGNOSIS — Z6841 Body Mass Index (BMI) 40.0 and over, adult: Secondary | ICD-10-CM | POA: Diagnosis not present

## 2021-05-26 DIAGNOSIS — F419 Anxiety disorder, unspecified: Secondary | ICD-10-CM | POA: Diagnosis not present

## 2021-05-26 DIAGNOSIS — Z114 Encounter for screening for human immunodeficiency virus [HIV]: Secondary | ICD-10-CM | POA: Diagnosis not present

## 2021-05-26 DIAGNOSIS — Z1159 Encounter for screening for other viral diseases: Secondary | ICD-10-CM

## 2021-05-26 DIAGNOSIS — Z0000 Encounter for general adult medical examination without abnormal findings: Secondary | ICD-10-CM

## 2021-05-26 LAB — MICROSCOPIC EXAMINATION
Epithelial Cells (non renal): NONE SEEN /hpf (ref 0–10)
RBC, Urine: NONE SEEN /hpf (ref 0–2)
WBC, UA: NONE SEEN /hpf (ref 0–5)

## 2021-05-26 LAB — URINALYSIS, ROUTINE W REFLEX MICROSCOPIC
Bilirubin, UA: NEGATIVE
Glucose, UA: NEGATIVE
Ketones, UA: NEGATIVE
Leukocytes,UA: NEGATIVE
Nitrite, UA: NEGATIVE
RBC, UA: NEGATIVE
Specific Gravity, UA: >1.03 — ABNORMAL HIGH (ref 1.005–1.030)
Urobilinogen, Ur: 0.2 mg/dL (ref 0.2–1.0)
pH, UA: 5.5 (ref 5.0–7.5)

## 2021-05-26 NOTE — Assessment & Plan Note (Signed)
Chronic. Well controlled without medication. Follow up if symptoms worsen or fail to improve.

## 2021-05-26 NOTE — Progress Notes (Signed)
Hi Andrew Mccormick. Your urine from today looks good.  I will send you another message once the rest of your lab work comes back.

## 2021-05-26 NOTE — Assessment & Plan Note (Signed)
Recommend a healthy lifestyle through diet and exercise.  °

## 2021-05-27 LAB — CBC WITH DIFFERENTIAL/PLATELET
Basophils Absolute: 0 10*3/uL (ref 0.0–0.2)
Basos: 1 %
EOS (ABSOLUTE): 0.1 10*3/uL (ref 0.0–0.4)
Eos: 2 %
Hematocrit: 38.4 % (ref 37.5–51.0)
Hemoglobin: 12.4 g/dL — ABNORMAL LOW (ref 13.0–17.7)
Immature Grans (Abs): 0 10*3/uL (ref 0.0–0.1)
Immature Granulocytes: 0 %
Lymphocytes Absolute: 2.1 10*3/uL (ref 0.7–3.1)
Lymphs: 44 %
MCH: 24.7 pg — ABNORMAL LOW (ref 26.6–33.0)
MCHC: 32.3 g/dL (ref 31.5–35.7)
MCV: 76 fL — ABNORMAL LOW (ref 79–97)
Monocytes Absolute: 0.3 10*3/uL (ref 0.1–0.9)
Monocytes: 5 %
Neutrophils Absolute: 2.3 10*3/uL (ref 1.4–7.0)
Neutrophils: 48 %
Platelets: 294 10*3/uL (ref 150–450)
RBC: 5.03 x10E6/uL (ref 4.14–5.80)
RDW: 13.2 % (ref 11.6–15.4)
WBC: 4.8 10*3/uL (ref 3.4–10.8)

## 2021-05-27 LAB — TSH: TSH: 1.95 u[IU]/mL (ref 0.450–4.500)

## 2021-05-27 LAB — HEPATITIS C ANTIBODY: Hep C Virus Ab: <0.1 s/co ratio (ref 0.0–0.9)

## 2021-05-27 LAB — COMPREHENSIVE METABOLIC PANEL
ALT: 50 IU/L — ABNORMAL HIGH (ref 0–44)
AST: 34 IU/L (ref 0–40)
Albumin/Globulin Ratio: 1.9 (ref 1.2–2.2)
Albumin: 5 g/dL (ref 4.1–5.2)
Alkaline Phosphatase: 53 IU/L (ref 44–121)
BUN/Creatinine Ratio: 13 (ref 9–20)
BUN: 12 mg/dL (ref 6–20)
Bilirubin Total: 0.4 mg/dL (ref 0.0–1.2)
CO2: 25 mmol/L (ref 20–29)
Calcium: 9 mg/dL (ref 8.7–10.2)
Chloride: 103 mmol/L (ref 96–106)
Creatinine, Ser: 0.96 mg/dL (ref 0.76–1.27)
Globulin, Total: 2.7 g/dL (ref 1.5–4.5)
Glucose: 97 mg/dL (ref 70–99)
Potassium: 4.2 mmol/L (ref 3.5–5.2)
Sodium: 141 mmol/L (ref 134–144)
Total Protein: 7.7 g/dL (ref 6.0–8.5)
eGFR: 110 mL/min/{1.73_m2} (ref >59)

## 2021-05-27 LAB — LIPID PANEL
Chol/HDL Ratio: 5.5 ratio — ABNORMAL HIGH (ref 0.0–5.0)
Cholesterol, Total: 182 mg/dL (ref 100–199)
HDL: 33 mg/dL — ABNORMAL LOW (ref >39)
LDL Chol Calc (NIH): 117 mg/dL — ABNORMAL HIGH (ref 0–99)
Triglycerides: 182 mg/dL — ABNORMAL HIGH (ref 0–149)
VLDL Cholesterol Cal: 32 mg/dL (ref 5–40)

## 2021-05-27 LAB — HIV ANTIBODY (ROUTINE TESTING W REFLEX): HIV Screen 4th Generation wRfx: NONREACTIVE

## 2021-05-27 NOTE — Progress Notes (Signed)
Hi Andrew Mccormick. It was nice to meet you yesterday.  Your lab work shows that your cholesterol is elevated.  I recommend a low fat diet and exercise.  Your complete blood count is low but it is consistent with your prior labs over the last couple of years. We will continue to monitor this.  Otherwise you blood work looks great.  Please let me know if you have any questions.

## 2022-03-04 ENCOUNTER — Ambulatory Visit: Payer: BC Managed Care – PPO | Admitting: Physician Assistant

## 2022-03-04 DIAGNOSIS — Z23 Encounter for immunization: Secondary | ICD-10-CM | POA: Diagnosis not present

## 2022-05-19 ENCOUNTER — Ambulatory Visit: Payer: BC Managed Care – PPO | Admitting: Nurse Practitioner

## 2022-09-15 DIAGNOSIS — M955 Acquired deformity of pelvis: Secondary | ICD-10-CM | POA: Diagnosis not present

## 2022-09-15 DIAGNOSIS — M5416 Radiculopathy, lumbar region: Secondary | ICD-10-CM | POA: Diagnosis not present

## 2022-09-15 DIAGNOSIS — M9903 Segmental and somatic dysfunction of lumbar region: Secondary | ICD-10-CM | POA: Diagnosis not present

## 2022-09-15 DIAGNOSIS — M9905 Segmental and somatic dysfunction of pelvic region: Secondary | ICD-10-CM | POA: Diagnosis not present

## 2022-09-18 DIAGNOSIS — M5416 Radiculopathy, lumbar region: Secondary | ICD-10-CM | POA: Diagnosis not present

## 2022-09-18 DIAGNOSIS — M9903 Segmental and somatic dysfunction of lumbar region: Secondary | ICD-10-CM | POA: Diagnosis not present

## 2022-09-18 DIAGNOSIS — M9905 Segmental and somatic dysfunction of pelvic region: Secondary | ICD-10-CM | POA: Diagnosis not present

## 2022-09-18 DIAGNOSIS — M955 Acquired deformity of pelvis: Secondary | ICD-10-CM | POA: Diagnosis not present

## 2023-02-24 ENCOUNTER — Ambulatory Visit: Payer: BC Managed Care – PPO

## 2023-02-24 DIAGNOSIS — Z23 Encounter for immunization: Secondary | ICD-10-CM

## 2023-03-25 ENCOUNTER — Encounter: Payer: Self-pay | Admitting: Nurse Practitioner

## 2023-03-25 ENCOUNTER — Ambulatory Visit (INDEPENDENT_AMBULATORY_CARE_PROVIDER_SITE_OTHER): Payer: BC Managed Care – PPO | Admitting: Nurse Practitioner

## 2023-03-25 VITALS — BP 131/74 | HR 66 | Temp 97.9°F | Ht 71.8 in | Wt 281.6 lb

## 2023-03-25 DIAGNOSIS — Z23 Encounter for immunization: Secondary | ICD-10-CM | POA: Diagnosis not present

## 2023-03-25 DIAGNOSIS — Z0000 Encounter for general adult medical examination without abnormal findings: Secondary | ICD-10-CM | POA: Diagnosis not present

## 2023-03-25 DIAGNOSIS — E782 Mixed hyperlipidemia: Secondary | ICD-10-CM

## 2023-03-25 DIAGNOSIS — Z6841 Body Mass Index (BMI) 40.0 and over, adult: Secondary | ICD-10-CM | POA: Diagnosis not present

## 2023-03-25 LAB — URINALYSIS, ROUTINE W REFLEX MICROSCOPIC
Bilirubin, UA: NEGATIVE
Glucose, UA: NEGATIVE
Ketones, UA: NEGATIVE
Leukocytes,UA: NEGATIVE
Nitrite, UA: NEGATIVE
RBC, UA: NEGATIVE
Specific Gravity, UA: >1.03 — ABNORMAL HIGH (ref 1.005–1.030)
Urobilinogen, Ur: 0.2 mg/dL (ref 0.2–1.0)
pH, UA: 5.5 (ref 5.0–7.5)

## 2023-03-25 NOTE — Progress Notes (Signed)
BP 131/74   Pulse 66   Temp 97.9 F (36.6 C) (Oral)   Ht 5' 11.8" (1.824 m)   Wt 281 lb 9.6 oz (127.7 kg)   SpO2 98%   BMI 38.40 kg/m    Subjective:    Patient ID: Andrew Mccormick, male    DOB: 1991/03/29, 32 y.o.   MRN: [id number]  HPI: Andrew Mccormick is a 32 y.o. male presenting on 03/25/2023 for comprehensive medical examination. Current medical complaints include:none  He currently lives with: Interim Problems from his last visit: no   Denies HA, CP, SOB, dizziness, palpitations, visual changes, and lower extremity swelling.    Depression Screen done today and results listed below:     03/25/2023    3:17 PM 05/26/2021    8:19 AM 03/21/2020    8:01 AM 03/20/2019    8:12 AM 03/20/2018    5:53 PM  Depression screen PHQ 2/9  Decreased Interest 0 0 0 0 0  Down, Depressed, Hopeless 0 0 0 0 0  PHQ - 2 Score 0 0 0 0 0  Altered sleeping 1 0 0    Tired, decreased energy 1 0 0    Change in appetite 0 0 0    Feeling bad or failure about yourself  0 0 0    Trouble concentrating 0 0 0    Moving slowly or fidgety/restless 0 0 0    Suicidal thoughts 0 0 0    PHQ-9 Score 2 0 0    Difficult doing work/chores Not difficult at all Not difficult at all Not difficult at all      The patient does not have a history of falls. I did complete a risk assessment for falls. A plan of care for falls was documented.   Past Medical History:  Past Medical History:  Diagnosis Date   Anxiety     Surgical History:  Past Surgical History:  Procedure Laterality Date   WISDOM TOOTH EXTRACTION      Medications:  No current outpatient medications on file prior to visit.   No current facility-administered medications on file prior to visit.    Allergies:  No Known Allergies  Social History:  Social History   Socioeconomic History   Marital status: Single    Spouse name: Not on file   Number of children: Not on file   Years of education: Not on file   Highest education level: Not on  file  Occupational History   Not on file  Tobacco Use   Smoking status: Never   Smokeless tobacco: Never  Vaping Use   Vaping status: Never Used  Substance and Sexual Activity   Alcohol use: No   Drug use: No   Sexual activity: Yes  Other Topics Concern   Not on file  Social History Narrative   Not on file   Social Determinants of Health   Financial Resource Strain: Not on file  Food Insecurity: Not on file  Transportation Needs: Not on file  Physical Activity: Not on file  Stress: Not on file  Social Connections: Not on file  Intimate Partner Violence: Not on file   Social History   Tobacco Use  Smoking Status Never  Smokeless Tobacco Never   Social History   Substance and Sexual Activity  Alcohol Use No    Family History:  Family History  Problem Relation Age of Onset   Healthy Mother    Healthy Father    Cancer Maternal Aunt  Heart disease Paternal Grandfather    COPD Neg Hx    Diabetes Neg Hx    Stroke Neg Hx     Past medical history, surgical history, medications, allergies, family history and social history reviewed with patient today and changes made to appropriate areas of the chart.   Review of Systems  Eyes:  Negative for blurred vision and double vision.  Respiratory:  Negative for shortness of breath.   Cardiovascular:  Negative for chest pain, palpitations and leg swelling.  Neurological:  Negative for dizziness and headaches.   All other ROS negative except what is listed above and in the HPI.      Objective:    BP 131/74   Pulse 66   Temp 97.9 F (36.6 C) (Oral)   Ht 5' 11.8" (1.824 m)   Wt 281 lb 9.6 oz (127.7 kg)   SpO2 98%   BMI 38.40 kg/m   Wt Readings from Last 3 Encounters:  03/25/23 281 lb 9.6 oz (127.7 kg)  05/26/21 295 lb 9.6 oz (134.1 kg)  03/21/20 291 lb 9.6 oz (132.3 kg)    Physical Exam Vitals and nursing note reviewed.  Constitutional:      General: He is not in acute distress.    Appearance: Normal  appearance. He is obese. He is not ill-appearing, toxic-appearing or diaphoretic.  HENT:     Head: Normocephalic.     Right Ear: Tympanic membrane, ear canal and external ear normal.     Left Ear: Tympanic membrane, ear canal and external ear normal.     Nose: Nose normal. No congestion or rhinorrhea.     Mouth/Throat:     Mouth: Mucous membranes are moist.  Eyes:     General:        Right eye: No discharge.        Left eye: No discharge.     Extraocular Movements: Extraocular movements intact.     Conjunctiva/sclera: Conjunctivae normal.     Pupils: Pupils are equal, round, and reactive to light.  Cardiovascular:     Rate and Rhythm: Normal rate and regular rhythm.     Heart sounds: No murmur heard. Pulmonary:     Effort: Pulmonary effort is normal. No respiratory distress.     Breath sounds: Normal breath sounds. No wheezing, rhonchi or rales.  Abdominal:     General: Abdomen is flat. Bowel sounds are normal. There is no distension.     Palpations: Abdomen is soft.     Tenderness: There is no abdominal tenderness. There is no guarding.  Musculoskeletal:     Cervical back: Normal range of motion and neck supple.  Skin:    General: Skin is warm and dry.     Capillary Refill: Capillary refill takes less than 2 seconds.  Neurological:     General: No focal deficit present.     Mental Status: He is alert and oriented to person, place, and time.     Cranial Nerves: No cranial nerve deficit.     Motor: No weakness.     Deep Tendon Reflexes: Reflexes normal.  Psychiatric:        Mood and Affect: Mood normal.        Behavior: Behavior normal.        Thought Content: Thought content normal.        Judgment: Judgment normal.     Results for orders placed or performed in visit on 05/26/21  Microscopic Examination   Urine  Result Value  Ref Range   WBC, UA None seen 0 - 5 /hpf   RBC, Urine None seen 0 - 2 /hpf   Epithelial Cells (non renal) None seen 0 - 10 /hpf   Mucus, UA  Present (A) Not Estab.   Bacteria, UA Few (A) None seen/Few  TSH  Result Value Ref Range   TSH 1.950 0.450 - 4.500 uIU/mL  Lipid panel  Result Value Ref Range   Cholesterol, Total 182 100 - 199 mg/dL   Triglycerides 696 (H) 0 - 149 mg/dL   HDL 33 (L) >29 mg/dL   VLDL Cholesterol Cal 32 5 - 40 mg/dL   LDL Chol Calc (NIH) 528 (H) 0 - 99 mg/dL   Chol/HDL Ratio 5.5 (H) 0.0 - 5.0 ratio  CBC with Differential/Platelet  Result Value Ref Range   WBC 4.8 3.4 - 10.8 x10E3/uL   RBC 5.03 4.14 - 5.80 x10E6/uL   Hemoglobin 12.4 (L) 13.0 - 17.7 g/dL   Hematocrit 41.3 24.4 - 51.0 %   MCV 76 (L) 79 - 97 fL   MCH 24.7 (L) 26.6 - 33.0 pg   MCHC 32.3 31.5 - 35.7 g/dL   RDW 01.0 27.2 - 53.6 %   Platelets 294 150 - 450 x10E3/uL   Neutrophils 48 Not Estab. %   Lymphs 44 Not Estab. %   Monocytes 5 Not Estab. %   Eos 2 Not Estab. %   Basos 1 Not Estab. %   Neutrophils Absolute 2.3 1.4 - 7.0 x10E3/uL   Lymphocytes Absolute 2.1 0.7 - 3.1 x10E3/uL   Monocytes Absolute 0.3 0.1 - 0.9 x10E3/uL   EOS (ABSOLUTE) 0.1 0.0 - 0.4 x10E3/uL   Basophils Absolute 0.0 0.0 - 0.2 x10E3/uL   Immature Granulocytes 0 Not Estab. %   Immature Grans (Abs) 0.0 0.0 - 0.1 x10E3/uL  Comprehensive metabolic panel  Result Value Ref Range   Glucose 97 70 - 99 mg/dL   BUN 12 6 - 20 mg/dL   Creatinine, Ser 6.44 0.76 - 1.27 mg/dL   eGFR 034 >74 QV/ZDG/3.87   BUN/Creatinine Ratio 13 9 - 20   Sodium 141 134 - 144 mmol/L   Potassium 4.2 3.5 - 5.2 mmol/L   Chloride 103 96 - 106 mmol/L   CO2 25 20 - 29 mmol/L   Calcium 9.0 8.7 - 10.2 mg/dL   Total Protein 7.7 6.0 - 8.5 g/dL   Albumin 5.0 4.1 - 5.2 g/dL   Globulin, Total 2.7 1.5 - 4.5 g/dL   Albumin/Globulin Ratio 1.9 1.2 - 2.2   Bilirubin Total 0.4 0.0 - 1.2 mg/dL   Alkaline Phosphatase 53 44 - 121 IU/L   AST 34 0 - 40 IU/L   ALT 50 (H) 0 - 44 IU/L  Urinalysis, Routine w reflex microscopic  Result Value Ref Range   Specific Gravity, UA >1.030 (H) 1.005 - 1.030   pH, UA  5.5 5.0 - 7.5   Color, UA Yellow Yellow   Appearance Ur Clear Clear   Leukocytes,UA Negative Negative   Protein,UA 1+ (A) Negative/Trace   Glucose, UA Negative Negative   Ketones, UA Negative Negative   RBC, UA Negative Negative   Bilirubin, UA Negative Negative   Urobilinogen, Ur 0.2 0.2 - 1.0 mg/dL   Nitrite, UA Negative Negative   Microscopic Examination See below:   Hepatitis C Antibody  Result Value Ref Range   Hep C Virus Ab <0.1 0.0 - 0.9 s/co ratio  HIV Antibody (routine testing w rflx)  Result Value  Ref Range   HIV Screen 4th Generation wRfx Non Reactive Non Reactive      Assessment & Plan:   Problem List Items Addressed This Visit       Other   BMI 40.0-44.9, adult ([facility])    Recommended eating smaller high protein, low fat meals more frequently and exercising 30 mins a day 5 times a week with a goal of 10-15lb weight loss in the next 3 months.       Other Visit Diagnoses     Annual physical exam    -  Primary   Health maintenance reviewed during visit today.  Labs ordered at visit today.  Flu shot given.   Relevant Orders   TSH   Lipid panel   CBC with Differential/Platelet   Comprehensive metabolic panel   Urinalysis, Routine w reflex microscopic   Mixed hyperlipidemia       Labs ordered at visit today. Will make recommendations based on lab results.   Relevant Orders   Lipid panel   Need for COVID-19 vaccine       Relevant Orders   Pfizer Comirnaty Covid -19 Vaccine 14yrs and older        Discussed aspirin prophylaxis for myocardial infarction prevention and decision was it was not indicated  LABORATORY TESTING:  Health maintenance labs ordered today as discussed above.     IMMUNIZATIONS:   - Tdap: Tetanus vaccination status reviewed: last tetanus booster within 10 years. - Influenza: Up to date - Pneumovax: Not applicable - Prevnar: Not applicable - COVID: Not applicable - HPV: Not applicable - Shingrix vaccine: Not  applicable  SCREENING: - Colonoscopy: Not applicable  Discussed with patient purpose of the colonoscopy is to detect colon cancer at curable precancerous or early stages   - AAA Screening: Not applicable  -Hearing Test: Not applicable  -Spirometry: Not applicable   PATIENT COUNSELING:    Sexuality: Discussed sexually transmitted diseases, partner selection, use of condoms, avoidance of unintended pregnancy  and contraceptive alternatives.   Advised to avoid cigarette smoking.  I discussed with the patient that most people either abstain from alcohol or drink within safe limits (<=14/week and <=4 drinks/occasion for males, <=7/weeks and <= 3 drinks/occasion for females) and that the risk for alcohol disorders and other health effects rises proportionally with the number of drinks per week and how often a drinker exceeds daily limits.  Discussed cessation/primary prevention of drug use and availability of treatment for abuse.   Diet: Encouraged to adjust caloric intake to maintain  or achieve ideal body weight, to reduce intake of dietary saturated fat and total fat, to limit sodium intake by avoiding high sodium foods and not adding table salt, and to maintain adequate dietary potassium and calcium preferably from fresh fruits, vegetables, and low-fat dairy products.    stressed the importance of regular exercise  Injury prevention: Discussed safety belts, safety helmets, smoke detector, smoking near bedding or upholstery.   Dental health: Discussed importance of regular tooth brushing, flossing, and dental visits.   Follow up plan: NEXT PREVENTATIVE PHYSICAL DUE IN 1 YEAR. No follow-ups on file.

## 2023-03-25 NOTE — Assessment & Plan Note (Signed)
Recommended eating smaller high protein, low fat meals more frequently and exercising 30 mins a day 5 times a week with a goal of 10-15lb weight loss in the next 3 months.  

## 2023-03-26 LAB — LIPID PANEL
Chol/HDL Ratio: 4.7 {ratio} (ref 0.0–5.0)
Cholesterol, Total: 183 mg/dL (ref 100–199)
HDL: 39 mg/dL — ABNORMAL LOW (ref >39)
LDL Chol Calc (NIH): 115 mg/dL — ABNORMAL HIGH (ref 0–99)
Triglycerides: 165 mg/dL — ABNORMAL HIGH (ref 0–149)
VLDL Cholesterol Cal: 29 mg/dL (ref 5–40)

## 2023-03-26 LAB — CBC WITH DIFFERENTIAL/PLATELET
Basophils Absolute: 0 10*3/uL (ref 0.0–0.2)
Basos: 1 %
EOS (ABSOLUTE): 0.1 10*3/uL (ref 0.0–0.4)
Eos: 1 %
Hematocrit: 40.3 % (ref 37.5–51.0)
Hemoglobin: 13.6 g/dL (ref 13.0–17.7)
Immature Grans (Abs): 0 10*3/uL (ref 0.0–0.1)
Immature Granulocytes: 0 %
Lymphocytes Absolute: 2.5 10*3/uL (ref 0.7–3.1)
Lymphs: 41 %
MCH: 26.7 pg (ref 26.6–33.0)
MCHC: 33.7 g/dL (ref 31.5–35.7)
MCV: 79 fL (ref 79–97)
Monocytes Absolute: 0.3 10*3/uL (ref 0.1–0.9)
Monocytes: 5 %
Neutrophils Absolute: 3.2 10*3/uL (ref 1.4–7.0)
Neutrophils: 52 %
Platelets: 304 10*3/uL (ref 150–450)
RBC: 5.1 x10E6/uL (ref 4.14–5.80)
RDW: 13.4 % (ref 11.6–15.4)
WBC: 6.1 10*3/uL (ref 3.4–10.8)

## 2023-03-26 LAB — COMPREHENSIVE METABOLIC PANEL
ALT: 55 [IU]/L — ABNORMAL HIGH (ref 0–44)
AST: 29 [IU]/L (ref 0–40)
Albumin: 5.3 g/dL — ABNORMAL HIGH (ref 4.3–5.2)
Alkaline Phosphatase: 58 [IU]/L (ref 44–121)
BUN/Creatinine Ratio: 12 (ref 9–20)
BUN: 11 mg/dL (ref 6–20)
Bilirubin Total: 0.5 mg/dL (ref 0.0–1.2)
CO2: 21 mmol/L (ref 20–29)
Calcium: 10.1 mg/dL (ref 8.7–10.2)
Chloride: 102 mmol/L (ref 96–106)
Creatinine, Ser: 0.92 mg/dL (ref 0.76–1.27)
Globulin, Total: 2.8 g/dL (ref 1.5–4.5)
Glucose: 80 mg/dL (ref 70–99)
Potassium: 4.1 mmol/L (ref 3.5–5.2)
Sodium: 143 mmol/L (ref 134–144)
Total Protein: 8.1 g/dL (ref 6.0–8.5)
eGFR: 115 mL/min/{1.73_m2} (ref >59)

## 2023-03-26 LAB — TSH: TSH: 1.52 u[IU]/mL (ref 0.450–4.500)

## 2023-03-26 NOTE — Progress Notes (Signed)
HI Andrew Mccormick. It was nice to see you yesterday.  Your lab work looks good.  Cholesterol remains slightly elevated.  I recommend a low fat diet and exercise. No other concerns at this time. Continue with your current medication regimen.  Follow up as discussed.  Please let me know if you have any questions.

## 2024-01-19 ENCOUNTER — Other Ambulatory Visit: Payer: Self-pay | Admitting: Medical Genetics

## 2024-01-20 ENCOUNTER — Telehealth: Payer: Self-pay | Admitting: Nurse Practitioner

## 2024-01-20 NOTE — Telephone Encounter (Signed)
 Called patient to let him know that his next appt on 9/4 will not be a Physical appt. He can have a regular office visit and keep the appt if he wants. Physical is due until after 10/17 and if he has the same insurance, they will not cover it until after that date.

## 2024-01-21 ENCOUNTER — Other Ambulatory Visit
Admission: RE | Admit: 2024-01-21 | Discharge: 2024-01-21 | Disposition: A | Discharge status: Home / Self Care | Payer: Self-pay | Source: Ambulatory Visit | Attending: Medical Genetics | Admitting: Medical Genetics

## 2024-01-31 LAB — GENECONNECT MOLECULAR SCREEN: Genetic Analysis Overall Interpretation: NEGATIVE

## 2024-02-10 ENCOUNTER — Ambulatory Visit: Admitting: Nurse Practitioner

## 2024-03-22 ENCOUNTER — Ambulatory Visit

## 2024-03-22 DIAGNOSIS — Z23 Encounter for immunization: Secondary | ICD-10-CM

## 2024-05-09 DIAGNOSIS — L2089 Other atopic dermatitis: Secondary | ICD-10-CM | POA: Diagnosis not present
